# Patient Record
Sex: Female | Born: 1950 | Race: White | Hispanic: No | Marital: Single | Smoking: Never smoker
Health system: Southern US, Community
[De-identification: ages and names within clinical notes are randomized; demographics above are authoritative.]

## PROBLEM LIST (undated history)

## (undated) DIAGNOSIS — R0989 Other specified symptoms and signs involving the circulatory and respiratory systems: Secondary | ICD-10-CM

## (undated) DIAGNOSIS — M1990 Unspecified osteoarthritis, unspecified site: Secondary | ICD-10-CM

## (undated) DIAGNOSIS — E669 Obesity, unspecified: Secondary | ICD-10-CM

## (undated) DIAGNOSIS — K219 Gastro-esophageal reflux disease without esophagitis: Secondary | ICD-10-CM

## (undated) DIAGNOSIS — J45909 Unspecified asthma, uncomplicated: Secondary | ICD-10-CM

## (undated) DIAGNOSIS — D242 Benign neoplasm of left breast: Secondary | ICD-10-CM

## (undated) DIAGNOSIS — I10 Essential (primary) hypertension: Secondary | ICD-10-CM

## (undated) HISTORY — PX: CHOLECYSTECTOMY: SHX55

## (undated) HISTORY — PX: BREAST SURGERY: SHX581

## (undated) HISTORY — PX: DILATION AND CURETTAGE OF UTERUS: SHX78

---

## 2004-09-17 ENCOUNTER — Other Ambulatory Visit: Admission: RE | Admit: 2004-09-17 | Discharge: 2004-09-17 | Payer: Self-pay | Admitting: Obstetrics and Gynecology

## 2004-10-29 ENCOUNTER — Encounter (INDEPENDENT_AMBULATORY_CARE_PROVIDER_SITE_OTHER): Payer: Self-pay | Admitting: *Deleted

## 2004-10-29 ENCOUNTER — Ambulatory Visit (HOSPITAL_COMMUNITY): Admission: RE | Admit: 2004-10-29 | Discharge: 2004-10-29 | Payer: Self-pay | Admitting: Obstetrics and Gynecology

## 2005-10-11 ENCOUNTER — Other Ambulatory Visit: Admission: RE | Admit: 2005-10-11 | Discharge: 2005-10-11 | Payer: Self-pay | Admitting: Obstetrics and Gynecology

## 2005-11-15 ENCOUNTER — Encounter: Admission: RE | Admit: 2005-11-15 | Discharge: 2005-11-15 | Payer: Self-pay | Admitting: Obstetrics and Gynecology

## 2005-12-05 ENCOUNTER — Encounter: Admission: RE | Admit: 2005-12-05 | Discharge: 2005-12-05 | Payer: Self-pay | Admitting: Obstetrics and Gynecology

## 2005-12-05 ENCOUNTER — Encounter (INDEPENDENT_AMBULATORY_CARE_PROVIDER_SITE_OTHER): Payer: Self-pay | Admitting: *Deleted

## 2007-12-10 ENCOUNTER — Encounter: Admission: RE | Admit: 2007-12-10 | Discharge: 2007-12-10 | Payer: Self-pay | Admitting: Obstetrics and Gynecology

## 2008-02-04 ENCOUNTER — Encounter: Admission: RE | Admit: 2008-02-04 | Discharge: 2008-02-04 | Payer: Self-pay | Admitting: Surgery

## 2008-02-29 ENCOUNTER — Encounter (INDEPENDENT_AMBULATORY_CARE_PROVIDER_SITE_OTHER): Payer: Self-pay | Admitting: Diagnostic Radiology

## 2008-02-29 ENCOUNTER — Encounter: Admission: RE | Admit: 2008-02-29 | Discharge: 2008-02-29 | Payer: Self-pay | Admitting: Surgery

## 2008-06-20 HISTORY — PX: EYE SURGERY: SHX253

## 2010-07-11 ENCOUNTER — Encounter: Payer: Self-pay | Admitting: Obstetrics and Gynecology

## 2010-11-05 NOTE — H&P (Signed)
NAMERIVEN, BEEBE NO.:  [id number]   MEDICAL RECORD NO.:  [id number]           PATIENT TYPE:   LOCATION:                                 FACILITY:   PHYSICIAN:  Guy Sandifer. Henderson Cloud, M.D.      DATE OF BIRTH:   DATE OF ADMISSION:  10/29/2004  DATE OF DISCHARGE:                                HISTORY & PHYSICAL   CHIEF COMPLAINT:  Postmenopausal bleeding.   HISTORY OF PRESENT ILLNESS:  The patient is a 60 year old single white  female, G0, P0, who was on the birth control pill until about 3 years ago.  She then had menses for several months, but that completely stopped for the  past 2 years.  In February of this year, she had 4-5 heavy days of bleeding  with cramping and clotting.  The bleeding has now stopped.  Ultrasound on  Oct 18, 2004 was consistent with the uterus measuring 10.5 x 5.9 x 6.7 cm.  Endometrial stripe is 1.5 cm.  Sonohystogram is negative for masses.  Pap  smear was benign.  After discussion of the patient's the patient is being  admitted for hysteroscopy D&C.  Potential risks and complications have been  reviewed with the patient preoperatively.   PAST MEDICAL HISTORY:  1.  Asthma.  2.  Complains of dependent edema when the weather gets warm.   PAST SURGICAL HISTORY:  Knee injury several years ago.   FAMILY HISTORY:  Multiple gestation in brother's family.  Diabetes in  maternal grandmother.  Lung cancer in father.  Colon cancer in mother.  Lung  and breast cancer in mother.  Thyroid cancer in brother.  Bladder cancer in  another brother.  Chronic hypertension in mother.  Congestive heart failure  in mother.  Renal failure in mother.   MEDICATIONS:  Inhalers p.r.n.   ALLERGIES:  No known drug allergies.   SOCIAL HISTORY:  Denies tobacco, alcohol, or drug abuse.   REVIEW OF SYSTEMS:  NEUROLOGIC:  Denies headache.  CARDIAC:  Denies chest  pain.  PULMONARY:  Denies shortness of breath.  GI:  Denies recent changes  in bowel habits.   PHYSICAL EXAMINATION:  VITAL SIGNS:  Height 5 feet, 4.5 inches.  Weight  321.5 pounds.  Blood pressure 146/86.  HEENT:  Without thyromegaly.  LUNGS:  Clear to auscultation.  HEART:  Regular rate and rhythm.  BACK:  Without CVA tenderness.  BREASTS:  Without mass, retraction, or discharge.  ABDOMEN:  Obese, soft, nontender, without palpable masses.  PELVIC:  Vulva, vagina, and cervix without lesion.  Pelvic exam totally  compromised with patient's habitus.  EXTREMITIES/NEUROLOGICAL EXAM:  Grossly within normal limits.   ASSESSMENT:  Postmenopausal bleeding.   PLAN:  Hysteroscopy D&C.      JET/MEDQ  D:  10/27/2004  T:  10/27/2004  Job:  [id number]

## 2010-11-05 NOTE — Op Note (Signed)
Danielle [name], Danielle [name]                 ACCOUNT NO.:  [id number]   MEDICAL RECORD NO.:  [id number]          PATIENT TYPE:  AMB   LOCATION:  SDC                           FACILITY:  [facility]   PHYSICIAN:  Guy Sandifer. Henderson Cloud, M.D. DATE OF BIRTH:  07/28/1950   DATE OF PROCEDURE:  10/29/2004  DATE OF DISCHARGE:                                 OPERATIVE REPORT   PREOPERATIVE DIAGNOSIS:  Postmenopausal bleeding.   POSTOPERATIVE DIAGNOSIS:  Postmenopausal bleeding.   PROCEDURES:  1.  Hysteroscopy with dilatation and curettage.  2.  Xylocaine 1% paracervical block.   SURGEON:  Guy Sandifer. Henderson Cloud, M.D.   ANESTHESIA:  MAC.   SPECIMENS:  Endometrial curettings.   I&O OF SORBITOL DISTENDING MEDIUM:  Even.   INDICATIONS AND CONSENT:  This patient is a 60 year old single white female,  G0, P0, with postmenopausal bleeding.  Details are dictated in the history  and physical.  Hysteroscopy, dilatation and curettage is discussed with the  patient.  The potential risks and complications have been discussed  preoperatively, including but not limited to infection, uterine perforation,  bowel, bladder or ureteral damage, bleeding requiring transfusion of blood  products with a possible transfusion reaction, HIV and hepatitis  acquisition, DVT, PE, and pneumonia, laparoscopy, laparotomy, hysterectomy.  All questions have been answered, and consent is signed on the chart.   FINDINGS:  Endometrial cavity is normal in appearance.  The fallopian tube  ostia are identified bilaterally.   PROCEDURE:  The patient was taken to the operating room, where she is  identified, placed in the dorsal supine position and given intravenous  anesthesia.  She is then placed in the dorsal lithotomy position, where she  is gently prepped, the bladder is straight-catheterized, and she is draped  in a sterile fashion.  A bivalve speculum is placed in the vagina and  anterior cervical lip is injected with 1% Xylocaine and grasped  with a  single-tooth tenaculum.  A paracervical block at the 2, 4, 5, 7, 8 and 10  o'clock positions with approximately 20 mL total of 1% Xylocaine plain is  done.  The cervix is then gently progressively dilated to a 25 Pratt  dilator.  Diagnostic hysteroscope is then placed in the endocervical canal  and advanced under direct visualization using sorbitol distending media.  The above  findings are noted.  The hysteroscope is withdrawn and sharp curettage is  carried out.  Good hemostasis is noted.  All instruments are removed and  good hemostasis is again noted.  All counts are correct.  The patient is  awakened and taken to the recovery room in stable condition.      JET/MEDQ  D:  10/29/2004  T:  10/29/2004  Job:  [id number]

## 2013-11-14 ENCOUNTER — Other Ambulatory Visit: Payer: Self-pay | Admitting: Obstetrics and Gynecology

## 2013-11-14 DIAGNOSIS — R928 Other abnormal and inconclusive findings on diagnostic imaging of breast: Secondary | ICD-10-CM

## 2013-12-04 ENCOUNTER — Ambulatory Visit
Admission: RE | Admit: 2013-12-04 | Discharge: 2013-12-04 | Disposition: A | Discharge status: Home / Self Care | Payer: BC Managed Care – PPO | Source: Ambulatory Visit | Attending: Obstetrics and Gynecology | Admitting: Obstetrics and Gynecology

## 2013-12-04 ENCOUNTER — Encounter (INDEPENDENT_AMBULATORY_CARE_PROVIDER_SITE_OTHER): Payer: Self-pay

## 2013-12-04 DIAGNOSIS — R928 Other abnormal and inconclusive findings on diagnostic imaging of breast: Secondary | ICD-10-CM

## 2014-05-27 NOTE — Pre-Procedure Instructions (Addendum)
Danielle [name]  05/27/2014   Your procedure is scheduled on:  06/03/14  Report to [facility] cone short stay admitting at 800 AM.  Call this number if you have problems the morning of surgery: [phone]   Remember:   Do not eat food or drink liquids after midnight.   Take these medicines the morning of surgery with A SIP OF WATER: inhalers if needed, bring albuterol inhaler to hosp  STOP all herbel meds, nsaids (aleve,naproxen,advil,ibuprofen) 5 days prior to surgery(tomorrow 05/29/14) including vitamins, aspirin, nabumatone   Do not wear jewelry, make-up or nail polish.  Do not wear lotions, powders, or perfumes. You may wear deodorant.  Do not shave 48 hours prior to surgery. Men may shave face and neck.  Do not bring valuables to the hospital.  [facility] is not responsible                  for any belongings or valuables.               Contacts, dentures or bridgework may not be worn into surgery.  Leave suitcase in the car. After surgery it may be brought to your room.  For patients admitted to the hospital, discharge time is determined by your                treatment team.               Patients discharged the day of surgery will not be allowed to drive  home.  Name and phone number of your driver:   Special Instructions:  Special Instructions: [facility] - Preparing for Surgery  Before surgery, you can play an important role.  Because skin is not sterile, your skin needs to be as free of germs as possible.  You can reduce the number of germs on you skin by washing with CHG (chlorahexidine gluconate) soap before surgery.  CHG is an antiseptic cleaner which kills germs and bonds with the skin to continue killing germs even after washing.  Please DO NOT use if you have an allergy to CHG or antibacterial soaps.  If your skin becomes reddened/irritated stop using the CHG and inform your nurse when you arrive at Short Stay.  Do not shave (including legs and underarms) for at least 48  hours prior to the first CHG shower.  You may shave your face.  Please follow these instructions carefully:   1.  Shower with CHG Soap the night before surgery and the morning of Surgery.  2.  If you choose to wash your hair, wash your hair first as usual with your normal shampoo.  3.  After you shampoo, rinse your hair and body thoroughly to remove the Shampoo.  4.  Use CHG as you would any other liquid soap.  You can apply chg directly  to the skin and wash gently with scrungie or a clean washcloth.  5.  Apply the CHG Soap to your body ONLY FROM THE NECK DOWN.  Do not use on open wounds or open sores.  Avoid contact with your eyes ears, mouth and genitals (private parts).  Wash genitals (private parts)       with your normal soap.  6.  Wash thoroughly, paying special attention to the area where your surgery will be performed.  7.  Thoroughly rinse your body with warm water from the neck down.  8.  DO NOT shower/wash with your normal soap after using and rinsing off the CHG Soap.  9.  Pat yourself dry with a clean towel.            10.  Wear clean pajamas.            11.  Place clean sheets on your bed the night of your first shower and do not sleep with pets.  Day of Surgery  Do not apply any lotions/deodorants the morning of surgery.  Please wear clean clothes to the hospital/surgery center.   Please read over the following fact sheets that you were given: Pain Booklet, Coughing and Deep Breathing, Blood Transfusion Information, Total Joint Packet, MRSA Information and Surgical Site Infection Prevention

## 2014-05-28 ENCOUNTER — Encounter (HOSPITAL_COMMUNITY)
Admission: RE | Admit: 2014-05-28 | Discharge: 2014-05-28 | Disposition: A | Discharge status: Home / Self Care | Payer: BC Managed Care – PPO | Source: Ambulatory Visit | Attending: Orthopaedic Surgery | Admitting: Orthopaedic Surgery

## 2014-05-28 ENCOUNTER — Encounter (HOSPITAL_COMMUNITY): Payer: Self-pay

## 2014-05-28 ENCOUNTER — Encounter (HOSPITAL_COMMUNITY)
Admission: RE | Admit: 2014-05-28 | Discharge: 2014-05-28 | Disposition: A | Discharge status: Home / Self Care | Payer: BC Managed Care – PPO | Source: Ambulatory Visit | Attending: Orthopedic Surgery | Admitting: Orthopedic Surgery

## 2014-05-28 DIAGNOSIS — M1990 Unspecified osteoarthritis, unspecified site: Secondary | ICD-10-CM | POA: Insufficient documentation

## 2014-05-28 DIAGNOSIS — I10 Essential (primary) hypertension: Secondary | ICD-10-CM | POA: Insufficient documentation

## 2014-05-28 DIAGNOSIS — K219 Gastro-esophageal reflux disease without esophagitis: Secondary | ICD-10-CM | POA: Insufficient documentation

## 2014-05-28 DIAGNOSIS — Z01818 Encounter for other preprocedural examination: Secondary | ICD-10-CM | POA: Diagnosis present

## 2014-05-28 DIAGNOSIS — Z9049 Acquired absence of other specified parts of digestive tract: Secondary | ICD-10-CM | POA: Insufficient documentation

## 2014-05-28 DIAGNOSIS — J45909 Unspecified asthma, uncomplicated: Secondary | ICD-10-CM | POA: Diagnosis not present

## 2014-05-28 HISTORY — DX: Unspecified asthma, uncomplicated: J45.909

## 2014-05-28 HISTORY — DX: Gastro-esophageal reflux disease without esophagitis: K21.9

## 2014-05-28 HISTORY — DX: Unspecified osteoarthritis, unspecified site: M19.90

## 2014-05-28 HISTORY — DX: Essential (primary) hypertension: I10

## 2014-05-28 LAB — SURGICAL PCR SCREEN
MRSA, PCR: NEGATIVE
Staphylococcus aureus: NEGATIVE

## 2014-05-28 LAB — URINALYSIS, ROUTINE W REFLEX MICROSCOPIC
BILIRUBIN URINE: NEGATIVE
Glucose, UA: NEGATIVE mg/dL
HGB URINE DIPSTICK: NEGATIVE
Ketones, ur: NEGATIVE mg/dL
Leukocytes, UA: NEGATIVE
Nitrite: NEGATIVE
PH: 6.5 (ref 5.0–8.0)
Protein, ur: NEGATIVE mg/dL
SPECIFIC GRAVITY, URINE: 1.022 (ref 1.005–1.030)
UROBILINOGEN UA: 0.2 mg/dL (ref 0.0–1.0)

## 2014-05-28 LAB — CBC WITH DIFFERENTIAL/PLATELET
Basophils Absolute: 0 10*3/uL (ref 0.0–0.1)
Basophils Relative: 1 % (ref 0–1)
Eosinophils Absolute: 0.3 10*3/uL (ref 0.0–0.7)
Eosinophils Relative: 5 % (ref 0–5)
HEMATOCRIT: 39.3 % (ref 36.0–46.0)
HEMOGLOBIN: 13 g/dL (ref 12.0–15.0)
LYMPHS ABS: 1.1 10*3/uL (ref 0.7–4.0)
Lymphocytes Relative: 21 % (ref 12–46)
MCH: 31.9 pg (ref 26.0–34.0)
MCHC: 33.1 g/dL (ref 30.0–36.0)
MCV: 96.3 fL (ref 78.0–100.0)
Monocytes Absolute: 0.4 10*3/uL (ref 0.1–1.0)
Monocytes Relative: 7 % (ref 3–12)
NEUTROS ABS: 3.5 10*3/uL (ref 1.7–7.7)
NEUTROS PCT: 66 % (ref 43–77)
Platelets: 188 10*3/uL (ref 150–400)
RBC: 4.08 MIL/uL (ref 3.87–5.11)
RDW: 13.7 % (ref 11.5–15.5)
WBC: 5.2 10*3/uL (ref 4.0–10.5)

## 2014-05-28 LAB — PROTIME-INR
INR: 1.04 (ref 0.00–1.49)
PROTHROMBIN TIME: 13.7 s (ref 11.6–15.2)

## 2014-05-28 LAB — COMPREHENSIVE METABOLIC PANEL
ALK PHOS: 71 U/L (ref 39–117)
ALT: 15 U/L (ref 0–35)
ANION GAP: 13 (ref 5–15)
AST: 23 U/L (ref 0–37)
Albumin: 3.7 g/dL (ref 3.5–5.2)
BILIRUBIN TOTAL: 0.5 mg/dL (ref 0.3–1.2)
BUN: 15 mg/dL (ref 6–23)
CHLORIDE: 101 meq/L (ref 96–112)
CO2: 28 meq/L (ref 19–32)
CREATININE: 0.84 mg/dL (ref 0.50–1.10)
Calcium: 9.4 mg/dL (ref 8.4–10.5)
GFR, EST AFRICAN AMERICAN: 85 mL/min — AB (ref >90)
GFR, EST NON AFRICAN AMERICAN: 73 mL/min — AB (ref >90)
GLUCOSE: 94 mg/dL (ref 70–99)
POTASSIUM: 3.4 meq/L — AB (ref 3.7–5.3)
Sodium: 142 mEq/L (ref 137–147)
Total Protein: 7.5 g/dL (ref 6.0–8.3)

## 2014-05-28 LAB — APTT: APTT: 29 s (ref 24–37)

## 2014-05-28 LAB — ABO/RH: ABO/RH(D): O NEG

## 2014-05-28 LAB — TYPE AND SCREEN
ABO/RH(D): O NEG
Antibody Screen: NEGATIVE

## 2014-05-28 NOTE — Progress Notes (Signed)
req'd notes any cardiac tests drom dr Eber Hong carrillon Carloyn Jaeger

## 2014-05-28 NOTE — Progress Notes (Signed)
Unable to get teds due to calf size. Office called

## 2014-05-28 NOTE — Progress Notes (Signed)
   05/28/14 0823  OBSTRUCTIVE SLEEP APNEA  Have you ever been diagnosed with sleep apnea through a sleep study? No  Do you snore loudly (loud enough to be heard through closed doors)?  0  Do you often feel tired, fatigued, or sleepy during the daytime? 0  Has anyone observed you stop breathing during your sleep? 0  Do you have, or are you being treated for high blood pressure? 1  BMI more than 35 kg/m2? 1  Age over 62 years old? 1  Neck circumference greater than 40 cm/16 inches? 1 (16.5)  Gender: 0  Obstructive Sleep Apnea Score 4  Score 4 or greater  Results sent to PCP

## 2014-05-29 LAB — URINE CULTURE

## 2014-05-29 NOTE — Progress Notes (Signed)
Anesthesia Chart Review:  Patient is a 63 year old female scheduled for right TKR on 06/03/14 by Dr. Durward Fortes.  History includes non-smoker, HTN, GERD, asthma, arthritis, cholecystectomy. BMI is consistent with morbid obesity.  OSA screening score was 4. PCP is Dr. Eber Hong with [facility] in [place].  She was seen by Mauricio Po, FNP on 05/13/14 for a preoperative evaluation and was felt to be low risk for surgery from a cardiac standpoint. (See note in Care Everywhere)  EKG on 05/28/14 showed: NSR, low voltage QRS.  I don't currently have a comparison EKG, but 06/13/11 EKG interpretation in Care Everywhere read: NSR, low voltage QRS, cannot rule out anterior infarct (age undetermined) which would be consistent with her most recent EKG findings.    05/28/14 CXR: No active cardiopulmonary disease. Slight hyper aeration.  Preoperative labs noted. Urine culture is still pending.  She was cleared by her PCP for surgery.  If no acute changes then I would anticipate that she could proceed as planned.  George Hugh [facility] Short Stay Center/Anesthesiology Phone [phone] 05/29/2014 11:47 AM

## 2014-06-02 MED ORDER — SODIUM CHLORIDE 0.9 % IV SOLN: INTRAVENOUS | Status: DC

## 2014-06-02 MED ORDER — ACETAMINOPHEN 10 MG/ML IV SOLN
1000.0000 mg | Freq: Four times a day (QID) | INTRAVENOUS | Status: DC
  Administered 2014-06-03 (×2): 1000 mg via INTRAVENOUS
  Filled 2014-06-02: qty 100

## 2014-06-02 MED ORDER — CHLORHEXIDINE GLUCONATE 4 % EX LIQD
60.0000 mL | Freq: Once | CUTANEOUS | Status: DC
Start: 2014-06-02 — End: 2014-06-03
  Filled 2014-06-02: qty 60

## 2014-06-02 MED ORDER — DEXTROSE 5 % IV SOLN
3.0000 g | INTRAVENOUS | Status: AC
  Administered 2014-06-03: 3 g via INTRAVENOUS
  Filled 2014-06-02: qty 3000

## 2014-06-02 NOTE — H&P (Signed)
CHIEF COMPLAINT:  Painful right knee.     HISTORY OF PRESENT ILLNESS:  Danielle [name] is a very pleasant 63 year old white female who is seen today for evaluation of her right knee.  She has had about 10 years of problems with the knee.  She has been treated by a physician, Dr. Tamera Punt, in [place] and has had several cortisone injections and Euflexxa injections about 3 years ago.  She states her pain is about 8/10 now.  She had tried physical therapy for a fairly good period of time last year.  She has been on Relafen, Voltaren, and several cortisone injections.  She has even tried the diclofenac or Voltaren gel.  She states that she has pretty much pain with every step, some nighttime pain.  She does have some catching and is starting to have problems with her activities of daily living.  She can almost do her light housework but, other than that, she is pretty much unable to do it.  She walks with a cane at this time.  She is continuing again having popping/grinding symptoms and trouble with her balance secondary to her pain.  Seen today for evaluation.     PAST MEDICAL HISTORY:  In general, her health is fair.     PAST SURGICAL HISTORY:   1.  Dilatation and curettage.  2.  Laparoscopic cholecystectomy.  3.  Right breast biopsy. 4.  June and September 2010, cataract extractions. 5.  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   6.  She had removal of oil bubble from the right eye in August 2013.     MEDICATIONS:   1.  Hydrochlorothiazide 25 mg daily.  2.  Relafen 750 b.i.d.  3.  Flovent HFA 110 mcg b.i.d.  4.  Ventolin HFA 90 mcg 2 puffs as needed p.r.n.  5.  Voltaren gel 3-4 times a day   ALLERGIES:  None known.    REVIEW OF SYSTEMS:  A 14-point review of systems is positive for a cataract extraction as well as retinal detachments.  She had bronchitis many years ago.  She has had hypertension for about 5 years.  Apparently she has asthma and uses an  inhaler.  This is more seasonal.  Her last bladder infection was 5-6 years ago.     FAMILY HISTORY:  Positive for a mother who died at age 64 from congestive heart failure and dementia.  She did have gout and hypertension.  Father deceased at age 32 from lung cancer.  Brother deceased at age 3 from bladder cancer with metastasis to bone.  She has 3 other brothers, age 8, 26 and 53, who are alive and well.  One sister who has arthritis and is still alive at age 47.     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  She denies use of tobacco or alcohol.     PHYSICAL EXAMINATION:  Exam today reveals a 63 year old white female, well developed, well nourished, obese, alert, cooperative, in moderate distress secondary to right knee pain.  Height is 62-1/2-inches, weight 272 pounds, BMI 49. Vital signs:  Temperature 97.6, pulse 95, respirations 16, blood pressure 161/84.   Head:  Normocephalic.  Eyes:  Pupils equal, round and reactive to light and accomodation with extraocular movements intact.  Ears, nose and throat:  Benign. Chest:  Good expansion.   Lungs:  Essentially clear with decreased breath sounds. Cardiac:  Had a  regular rhythm and rate.  Markedly distant heart sounds and no murmurs noted. Pulses:  Trace to 1+ in lower extremities, bilateral and symmetric.   CNS:  She is oriented x3 and cranial nerves II-XII grossly intact.  Genitorectal and breast:  Exam not indicated for an orthopedic evaluation.   Musculoskeletal:  The right knee can range from about 10-90 degrees only.  Diffuse tenderness to palpation over the medial joint.  Crepitance with range of motion.  Really cannot tell if she has an effusion or not.  Ligamentously she appears to have a little bit of instability of the knee with varus and valgus stressing.  Calves are supple and nontender.  Neurovascularly intact distally.  Good motion of the hips bilaterally.     RADIOGRAPHS:  Radiographic  studies reveal x-rays, AP, lateral and patella, standing, of her right knee, which has marked varus position.  She is bone-on-bone, medial compartment, with very large osteophytes, both medially and laterally.     CLINICAL IMPRESSION:   1.  Primary end-stage OA of the right knee. 2.  History of hypertension. 3.  History of asthma. 4.  Exogenous obesity with BMI of 49.     RECOMMENDATIONS:   1.  At this time, I have reviewed documentation from Dr. Brynda Greathouse, who feels that she is a candidate for a total knee replacement from both primary care and medicine, as well as from cardiology.  Felt to be a low risk for that.   2.  Because of her continued pain and discomfort, our plan is to proceed with right total knee arthroplasty.  The procedure risks and benefits were fully explained to her in detail using models.  Questions were answered completely.  She is now ready to proceed with total joint replacement.    Mike Craze [place], [place] [phone]  06/02/2014 4:49 PM

## 2014-06-02 NOTE — Anesthesia Preprocedure Evaluation (Signed)
Anesthesia Evaluation  Patient identified by MRN, date of birth, ID band Patient awake    Reviewed: Allergy & Precautions, H&P , NPO status , Patient's Chart, lab work & pertinent test results, reviewed documented beta blocker date and time   Airway        Dental   Pulmonary asthma (inhaler) ,          Cardiovascular hypertension, Pt. on medications  EKG ok   Neuro/Psych    GI/Hepatic GERD-  Medicated,  Endo/Other    Renal/GU      Musculoskeletal   Abdominal   Peds  Hematology   Anesthesia Other Findings   Reproductive/Obstetrics                             Anesthesia Physical Anesthesia Plan  ASA: III  Anesthesia Plan: General   Post-op Pain Management: MAC Combined w/ Regional for Post-op pain   Induction: Intravenous  Airway Management Planned: Oral ETT  Additional Equipment:   Intra-op Plan:   Post-operative Plan: Extubation in OR  Informed Consent: I have reviewed the patients History and Physical, chart, labs and discussed the procedure including the risks, benefits and alternatives for the proposed anesthesia with the patient or authorized representative who has indicated his/her understanding and acceptance.     Plan Discussed with:   Anesthesia Plan Comments:         Anesthesia Quick Evaluation

## 2014-06-03 ENCOUNTER — Encounter (HOSPITAL_COMMUNITY)
Admission: RE | Disposition: A | Discharge status: Skilled Nursing Facility | Payer: Self-pay | Source: Ambulatory Visit | Attending: Orthopaedic Surgery

## 2014-06-03 ENCOUNTER — Inpatient Hospital Stay (HOSPITAL_COMMUNITY): Payer: BC Managed Care – PPO | Admitting: Vascular Surgery

## 2014-06-03 ENCOUNTER — Inpatient Hospital Stay (HOSPITAL_COMMUNITY)
Admission: RE | Admit: 2014-06-03 | Discharge: 2014-06-05 | DRG: 470 | Disposition: A | Discharge status: Skilled Nursing Facility | Payer: BC Managed Care – PPO | Source: Ambulatory Visit | Attending: Orthopaedic Surgery | Admitting: Orthopaedic Surgery

## 2014-06-03 ENCOUNTER — Encounter (HOSPITAL_COMMUNITY): Payer: Self-pay | Admitting: Certified Registered Nurse Anesthetist

## 2014-06-03 ENCOUNTER — Inpatient Hospital Stay (HOSPITAL_COMMUNITY): Payer: BC Managed Care – PPO | Admitting: Anesthesiology

## 2014-06-03 DIAGNOSIS — K219 Gastro-esophageal reflux disease without esophagitis: Secondary | ICD-10-CM | POA: Diagnosis present

## 2014-06-03 DIAGNOSIS — E876 Hypokalemia: Secondary | ICD-10-CM | POA: Diagnosis not present

## 2014-06-03 DIAGNOSIS — Z808 Family history of malignant neoplasm of other organs or systems: Secondary | ICD-10-CM | POA: Diagnosis not present

## 2014-06-03 DIAGNOSIS — Z8052 Family history of malignant neoplasm of bladder: Secondary | ICD-10-CM

## 2014-06-03 DIAGNOSIS — M1711 Unilateral primary osteoarthritis, right knee: Secondary | ICD-10-CM

## 2014-06-03 DIAGNOSIS — Z801 Family history of malignant neoplasm of trachea, bronchus and lung: Secondary | ICD-10-CM | POA: Diagnosis not present

## 2014-06-03 DIAGNOSIS — Z6841 Body Mass Index (BMI) 40.0 and over, adult: Secondary | ICD-10-CM

## 2014-06-03 DIAGNOSIS — J45909 Unspecified asthma, uncomplicated: Secondary | ICD-10-CM | POA: Diagnosis present

## 2014-06-03 DIAGNOSIS — I10 Essential (primary) hypertension: Secondary | ICD-10-CM | POA: Diagnosis present

## 2014-06-03 DIAGNOSIS — Z7901 Long term (current) use of anticoagulants: Secondary | ICD-10-CM | POA: Diagnosis not present

## 2014-06-03 DIAGNOSIS — Z8249 Family history of ischemic heart disease and other diseases of the circulatory system: Secondary | ICD-10-CM

## 2014-06-03 DIAGNOSIS — Z96659 Presence of unspecified artificial knee joint: Secondary | ICD-10-CM

## 2014-06-03 DIAGNOSIS — Z79899 Other long term (current) drug therapy: Secondary | ICD-10-CM | POA: Diagnosis not present

## 2014-06-03 DIAGNOSIS — Z8261 Family history of arthritis: Secondary | ICD-10-CM | POA: Diagnosis not present

## 2014-06-03 DIAGNOSIS — M25561 Pain in right knee: Secondary | ICD-10-CM | POA: Diagnosis present

## 2014-06-03 HISTORY — PX: TOTAL KNEE ARTHROPLASTY: SHX125

## 2014-06-03 SURGERY — ARTHROPLASTY, KNEE, TOTAL
Anesthesia: General | Site: Knee | Laterality: Right

## 2014-06-03 MED ORDER — LORATADINE 10 MG PO TABS
10.0000 mg | ORAL_TABLET | Freq: Every day | ORAL | Status: DC
  Administered 2014-06-03 – 2014-06-05 (×3): 10 mg via ORAL
  Filled 2014-06-03 (×3): qty 1

## 2014-06-03 MED ORDER — KETOROLAC TROMETHAMINE 15 MG/ML IJ SOLN
7.5000 mg | Freq: Four times a day (QID) | INTRAMUSCULAR | Status: AC
  Administered 2014-06-03 – 2014-06-04 (×3): 7.5 mg via INTRAVENOUS

## 2014-06-03 MED ORDER — LIDOCAINE HCL (CARDIAC) 10 MG/ML IV SOLN
INTRAVENOUS | Status: DC | PRN
  Administered 2014-06-03: 100 mg via INTRAVENOUS

## 2014-06-03 MED ORDER — METHOCARBAMOL 500 MG PO TABS
500.0000 mg | ORAL_TABLET | Freq: Four times a day (QID) | ORAL | Status: DC | PRN
Start: 2014-06-03 — End: 2014-06-05
  Administered 2014-06-04: 500 mg via ORAL
  Filled 2014-06-03: qty 1

## 2014-06-03 MED ORDER — PROPOFOL 10 MG/ML IV BOLUS
INTRAVENOUS | Status: DC | PRN
  Administered 2014-06-03: 170 mg via INTRAVENOUS

## 2014-06-03 MED ORDER — FENTANYL CITRATE 0.05 MG/ML IJ SOLN
INTRAMUSCULAR | Status: AC
  Filled 2014-06-03: qty 5

## 2014-06-03 MED ORDER — PHENOL 1.4 % MT LIQD: 1.0000 | OROMUCOSAL | Status: DC | PRN

## 2014-06-03 MED ORDER — HYDROMORPHONE HCL 1 MG/ML IJ SOLN: 1.0000 mg | INTRAMUSCULAR | Status: DC | PRN

## 2014-06-03 MED ORDER — METHOCARBAMOL 1000 MG/10ML IJ SOLN
500.0000 mg | Freq: Four times a day (QID) | INTRAVENOUS | Status: DC | PRN
  Filled 2014-06-03: qty 5

## 2014-06-03 MED ORDER — SODIUM CHLORIDE 0.9 % IR SOLN
Status: DC | PRN
  Administered 2014-06-03: 3000 mL

## 2014-06-03 MED ORDER — BISACODYL 5 MG PO TBEC
5.0000 mg | DELAYED_RELEASE_TABLET | Freq: Every day | ORAL | Status: DC | PRN
Start: 2014-06-03 — End: 2014-06-05

## 2014-06-03 MED ORDER — ARTIFICIAL TEARS OP OINT
TOPICAL_OINTMENT | OPHTHALMIC | Status: AC
  Filled 2014-06-03: qty 3.5

## 2014-06-03 MED ORDER — FENTANYL CITRATE 0.05 MG/ML IJ SOLN
INTRAMUSCULAR | Status: DC | PRN
  Administered 2014-06-03 (×2): 50 ug via INTRAVENOUS
  Administered 2014-06-03: 100 ug via INTRAVENOUS

## 2014-06-03 MED ORDER — NEOSTIGMINE METHYLSULFATE 10 MG/10ML IV SOLN
INTRAVENOUS | Status: AC
  Filled 2014-06-03: qty 1

## 2014-06-03 MED ORDER — THROMBIN 20000 UNITS EX SOLR
CUTANEOUS | Status: DC | PRN
  Administered 2014-06-03: 20000 [IU] via TOPICAL

## 2014-06-03 MED ORDER — PROMETHAZINE HCL 25 MG/ML IJ SOLN: 6.2500 mg | INTRAMUSCULAR | Status: DC | PRN

## 2014-06-03 MED ORDER — ONDANSETRON HCL 4 MG/2ML IJ SOLN: 4.0000 mg | Freq: Four times a day (QID) | INTRAMUSCULAR | Status: DC | PRN

## 2014-06-03 MED ORDER — ONDANSETRON HCL 4 MG/2ML IJ SOLN
INTRAMUSCULAR | Status: AC
  Filled 2014-06-03: qty 2

## 2014-06-03 MED ORDER — ALBUTEROL SULFATE (2.5 MG/3ML) 0.083% IN NEBU: 2.5000 mg | INHALATION_SOLUTION | Freq: Four times a day (QID) | RESPIRATORY_TRACT | Status: DC | PRN

## 2014-06-03 MED ORDER — ALBUTEROL SULFATE HFA 108 (90 BASE) MCG/ACT IN AERS: 2.0000 | INHALATION_SPRAY | Freq: Four times a day (QID) | RESPIRATORY_TRACT | Status: DC | PRN

## 2014-06-03 MED ORDER — FLUTICASONE PROPIONATE HFA 110 MCG/ACT IN AERO
2.0000 | INHALATION_SPRAY | Freq: Two times a day (BID) | RESPIRATORY_TRACT | Status: DC
  Administered 2014-06-04 (×2): 2 via RESPIRATORY_TRACT
  Filled 2014-06-03 (×2): qty 12

## 2014-06-03 MED ORDER — DOCUSATE SODIUM 100 MG PO CAPS
100.0000 mg | ORAL_CAPSULE | Freq: Two times a day (BID) | ORAL | Status: DC
  Administered 2014-06-03 – 2014-06-05 (×4): 100 mg via ORAL
  Filled 2014-06-03 (×4): qty 1

## 2014-06-03 MED ORDER — METOCLOPRAMIDE HCL 5 MG/ML IJ SOLN
INTRAMUSCULAR | Status: DC | PRN
  Administered 2014-06-03: 10 mg via INTRAVENOUS

## 2014-06-03 MED ORDER — GLYCOPYRROLATE 0.2 MG/ML IJ SOLN
INTRAMUSCULAR | Status: AC
  Filled 2014-06-03: qty 3

## 2014-06-03 MED ORDER — DEXAMETHASONE SODIUM PHOSPHATE 4 MG/ML IJ SOLN
INTRAMUSCULAR | Status: DC | PRN
  Administered 2014-06-03: 4 mg via INTRAVENOUS

## 2014-06-03 MED ORDER — SODIUM CHLORIDE 0.9 % IV SOLN
INTRAVENOUS | Status: DC
  Administered 2014-06-04: 03:00:00 via INTRAVENOUS

## 2014-06-03 MED ORDER — MAGNESIUM CITRATE PO SOLN: 1.0000 | Freq: Once | ORAL | Status: AC | PRN

## 2014-06-03 MED ORDER — FENTANYL CITRATE 0.05 MG/ML IJ SOLN: 25.0000 ug | INTRAMUSCULAR | Status: DC | PRN

## 2014-06-03 MED ORDER — CEFAZOLIN SODIUM-DEXTROSE 2-3 GM-% IV SOLR
2.0000 g | Freq: Four times a day (QID) | INTRAVENOUS | Status: AC
  Administered 2014-06-03 (×2): 2 g via INTRAVENOUS
  Filled 2014-06-03: qty 50

## 2014-06-03 MED ORDER — HYDROCHLOROTHIAZIDE 25 MG PO TABS
25.0000 mg | ORAL_TABLET | Freq: Every day | ORAL | Status: DC
  Administered 2014-06-03 – 2014-06-05 (×3): 25 mg via ORAL
  Filled 2014-06-03 (×3): qty 1

## 2014-06-03 MED ORDER — HYPROMELLOSE (GONIOSCOPIC) 2.5 % OP SOLN: 2.0000 [drp] | Freq: Three times a day (TID) | OPHTHALMIC | Status: DC | PRN

## 2014-06-03 MED ORDER — ACETAMINOPHEN 10 MG/ML IV SOLN
1000.0000 mg | Freq: Four times a day (QID) | INTRAVENOUS | Status: AC
  Administered 2014-06-03 – 2014-06-04 (×4): 1000 mg via INTRAVENOUS
  Filled 2014-06-03 (×5): qty 100

## 2014-06-03 MED ORDER — BUPIVACAINE-EPINEPHRINE 0.25% -1:200000 IJ SOLN
INTRAMUSCULAR | Status: DC | PRN
Start: 2014-06-03 — End: 2014-06-03
  Administered 2014-06-03: 30 mL

## 2014-06-03 MED ORDER — BUPIVACAINE-EPINEPHRINE (PF) 0.5% -1:200000 IJ SOLN
INTRAMUSCULAR | Status: DC | PRN
  Administered 2014-06-03: 30 mL via PERINEURAL

## 2014-06-03 MED ORDER — GLYCOPYRROLATE 0.2 MG/ML IJ SOLN
INTRAMUSCULAR | Status: DC | PRN
  Administered 2014-06-03: 0.6 mg via INTRAVENOUS
  Administered 2014-06-03: 0.2 mg via INTRAVENOUS

## 2014-06-03 MED ORDER — METOCLOPRAMIDE HCL 5 MG/ML IJ SOLN
INTRAMUSCULAR | Status: AC
  Filled 2014-06-03: qty 2

## 2014-06-03 MED ORDER — NEOSTIGMINE METHYLSULFATE 10 MG/10ML IV SOLN
INTRAVENOUS | Status: DC | PRN
  Administered 2014-06-03: 4 mg via INTRAVENOUS

## 2014-06-03 MED ORDER — METOCLOPRAMIDE HCL 10 MG PO TABS: 5.0000 mg | ORAL_TABLET | Freq: Three times a day (TID) | ORAL | Status: DC | PRN

## 2014-06-03 MED ORDER — FENTANYL CITRATE 0.05 MG/ML IJ SOLN
INTRAMUSCULAR | Status: AC
  Filled 2014-06-03: qty 2

## 2014-06-03 MED ORDER — OXYCODONE HCL 5 MG PO TABS
5.0000 mg | ORAL_TABLET | ORAL | Status: DC | PRN
  Administered 2014-06-04 – 2014-06-05 (×3): 10 mg via ORAL
  Filled 2014-06-03 (×3): qty 2

## 2014-06-03 MED ORDER — METOCLOPRAMIDE HCL 5 MG/ML IJ SOLN: 5.0000 mg | Freq: Three times a day (TID) | INTRAMUSCULAR | Status: DC | PRN

## 2014-06-03 MED ORDER — ACETAMINOPHEN 10 MG/ML IV SOLN
INTRAVENOUS | Status: AC
  Filled 2014-06-03: qty 100

## 2014-06-03 MED ORDER — SODIUM CHLORIDE 0.9 % IR SOLN
Status: DC | PRN
  Administered 2014-06-03: 1000 mL

## 2014-06-03 MED ORDER — MIDAZOLAM HCL 2 MG/2ML IJ SOLN
INTRAMUSCULAR | Status: AC
  Administered 2014-06-03: 1 mg
  Filled 2014-06-03: qty 2

## 2014-06-03 MED ORDER — FENTANYL CITRATE 0.05 MG/ML IJ SOLN
50.0000 ug | Freq: Once | INTRAMUSCULAR | Status: AC
  Administered 2014-06-03: 50 ug via INTRAVENOUS

## 2014-06-03 MED ORDER — LACTATED RINGERS IV SOLN
INTRAVENOUS | Status: DC
  Administered 2014-06-03: 09:00:00 via INTRAVENOUS

## 2014-06-03 MED ORDER — MENTHOL 3 MG MT LOZG: 1.0000 | LOZENGE | OROMUCOSAL | Status: DC | PRN

## 2014-06-03 MED ORDER — BUPIVACAINE-EPINEPHRINE (PF) 0.25% -1:200000 IJ SOLN
INTRAMUSCULAR | Status: AC
  Filled 2014-06-03: qty 30

## 2014-06-03 MED ORDER — RIVAROXABAN 10 MG PO TABS
10.0000 mg | ORAL_TABLET | Freq: Every day | ORAL | Status: DC
  Administered 2014-06-04 – 2014-06-05 (×2): 10 mg via ORAL
  Filled 2014-06-03 (×3): qty 1

## 2014-06-03 MED ORDER — LACTATED RINGERS IV SOLN
INTRAVENOUS | Status: DC | PRN
  Administered 2014-06-03 (×3): via INTRAVENOUS

## 2014-06-03 MED ORDER — PROPOFOL 10 MG/ML IV BOLUS
INTRAVENOUS | Status: AC
  Filled 2014-06-03: qty 20

## 2014-06-03 MED ORDER — KETOROLAC TROMETHAMINE 15 MG/ML IJ SOLN
INTRAMUSCULAR | Status: AC
  Filled 2014-06-03: qty 1

## 2014-06-03 MED ORDER — MIDAZOLAM HCL 2 MG/2ML IJ SOLN: 1.0000 mg | Freq: Once | INTRAMUSCULAR | Status: DC

## 2014-06-03 MED ORDER — THROMBIN 20000 UNITS EX SOLR
CUTANEOUS | Status: AC
  Filled 2014-06-03: qty 20000

## 2014-06-03 MED ORDER — ONDANSETRON HCL 4 MG PO TABS: 4.0000 mg | ORAL_TABLET | Freq: Four times a day (QID) | ORAL | Status: DC | PRN

## 2014-06-03 MED ORDER — LACTATED RINGERS IV SOLN: INTRAVENOUS | Status: DC

## 2014-06-03 MED ORDER — POLYVINYL ALCOHOL 1.4 % OP SOLN
2.0000 [drp] | Freq: Three times a day (TID) | OPHTHALMIC | Status: DC | PRN
  Filled 2014-06-03: qty 15

## 2014-06-03 MED ORDER — CEFAZOLIN SODIUM-DEXTROSE 2-3 GM-% IV SOLR
INTRAVENOUS | Status: AC
  Filled 2014-06-03: qty 50

## 2014-06-03 MED ORDER — SENNOSIDES-DOCUSATE SODIUM 8.6-50 MG PO TABS: 1.0000 | ORAL_TABLET | Freq: Every evening | ORAL | Status: DC | PRN

## 2014-06-03 MED ORDER — MEPERIDINE HCL 25 MG/ML IJ SOLN: 6.2500 mg | INTRAMUSCULAR | Status: DC | PRN

## 2014-06-03 MED ORDER — ONDANSETRON HCL 4 MG/2ML IJ SOLN
INTRAMUSCULAR | Status: DC | PRN
  Administered 2014-06-03: 4 mg via INTRAVENOUS

## 2014-06-03 MED ORDER — DEXAMETHASONE SODIUM PHOSPHATE 4 MG/ML IJ SOLN
INTRAMUSCULAR | Status: AC
  Filled 2014-06-03: qty 1

## 2014-06-03 SURGICAL SUPPLY — 64 items
BANDAGE ESMARK 6X9 LF (GAUZE/BANDAGES/DRESSINGS) ×1 IMPLANT
BLADE SAGITTAL 25.0X1.19X90 (BLADE) ×2 IMPLANT
BNDG CMPR 9X6 STRL LF SNTH (GAUZE/BANDAGES/DRESSINGS) ×1
BNDG ESMARK 6X9 LF (GAUZE/BANDAGES/DRESSINGS) ×2
BOWL SMART MIX CTS (DISPOSABLE) ×2 IMPLANT
CAP KNEE TOTAL 3 SIGMA ×1 IMPLANT
CEMENT HV SMART SET (Cement) ×4 IMPLANT
COVER SURGICAL LIGHT HANDLE (MISCELLANEOUS) ×3 IMPLANT
CUFF TOURNIQUET SINGLE 34IN LL (TOURNIQUET CUFF) IMPLANT
CUFF TOURNIQUET SINGLE 44IN (TOURNIQUET CUFF) ×1 IMPLANT
DRAPE EXTREMITY T 121X128X90 (DRAPE) ×2 IMPLANT
DRAPE IMP U-DRAPE 54X76 (DRAPES) ×2 IMPLANT
DRAPE PROXIMA HALF (DRAPES) ×1 IMPLANT
DRSG ADAPTIC 3X8 NADH LF (GAUZE/BANDAGES/DRESSINGS) ×2 IMPLANT
DRSG PAD ABDOMINAL 8X10 ST (GAUZE/BANDAGES/DRESSINGS) ×4 IMPLANT
DURAPREP 26ML APPLICATOR (WOUND CARE) ×4 IMPLANT
ELECT CAUTERY BLADE 6.4 (BLADE) ×2 IMPLANT
ELECT REM PT RETURN 9FT ADLT (ELECTROSURGICAL) ×2
ELECTRODE REM PT RTRN 9FT ADLT (ELECTROSURGICAL) ×1 IMPLANT
EVACUATOR 1/8 PVC DRAIN (DRAIN) IMPLANT
FACESHIELD WRAPAROUND (MASK) ×4 IMPLANT
FACESHIELD WRAPAROUND OR TEAM (MASK) ×2 IMPLANT
FLOSEAL 10ML (HEMOSTASIS) IMPLANT
GAUZE SPONGE 4X4 12PLY STRL (GAUZE/BANDAGES/DRESSINGS) ×2 IMPLANT
GLOVE BIOGEL PI IND STRL 8 (GLOVE) ×1 IMPLANT
GLOVE BIOGEL PI IND STRL 8.5 (GLOVE) ×1 IMPLANT
GLOVE BIOGEL PI INDICATOR 8 (GLOVE) ×1
GLOVE BIOGEL PI INDICATOR 8.5 (GLOVE) ×1
GLOVE ECLIPSE 8.0 STRL XLNG CF (GLOVE) ×4 IMPLANT
GLOVE SURG ORTHO 8.5 STRL (GLOVE) ×4 IMPLANT
GOWN STRL REUS W/ TWL LRG LVL3 (GOWN DISPOSABLE) ×2 IMPLANT
GOWN STRL REUS W/TWL 2XL LVL3 (GOWN DISPOSABLE) ×2 IMPLANT
GOWN STRL REUS W/TWL LRG LVL3 (GOWN DISPOSABLE) ×4
HANDPIECE INTERPULSE COAX TIP (DISPOSABLE) ×2
KIT BASIN OR (CUSTOM PROCEDURE TRAY) ×2 IMPLANT
KIT ROOM TURNOVER OR (KITS) ×2 IMPLANT
MANIFOLD NEPTUNE II (INSTRUMENTS) ×2 IMPLANT
NEEDLE 22X1 1/2 (OR ONLY) (NEEDLE) ×1 IMPLANT
NS IRRIG 1000ML POUR BTL (IV SOLUTION) ×2 IMPLANT
PACK TOTAL JOINT (CUSTOM PROCEDURE TRAY) ×2 IMPLANT
PACK UNIVERSAL I (CUSTOM PROCEDURE TRAY) ×2 IMPLANT
PAD ABD 8X10 STRL (GAUZE/BANDAGES/DRESSINGS) ×1 IMPLANT
PAD ARMBOARD 7.5X6 YLW CONV (MISCELLANEOUS) ×3 IMPLANT
PAD CAST 4YDX4 CTTN HI CHSV (CAST SUPPLIES) ×1 IMPLANT
PADDING CAST COTTON 4X4 STRL (CAST SUPPLIES) ×2
PADDING CAST COTTON 6X4 STRL (CAST SUPPLIES) ×2 IMPLANT
SET HNDPC FAN SPRY TIP SCT (DISPOSABLE) ×1 IMPLANT
STAPLER VISISTAT 35W (STAPLE) ×2 IMPLANT
SUCTION FRAZIER TIP 10 FR DISP (SUCTIONS) ×2 IMPLANT
SUT BONE WAX W31G (SUTURE) ×2 IMPLANT
SUT ETHIBOND NAB CT1 #1 30IN (SUTURE) ×6 IMPLANT
SUT MNCRL AB 3-0 PS2 18 (SUTURE) ×2 IMPLANT
SUT VIC AB 0 CT1 27 (SUTURE) ×2
SUT VIC AB 0 CT1 27XBRD ANBCTR (SUTURE) ×1 IMPLANT
SUT VIC AB 1 CT1 27 (SUTURE) ×2
SUT VIC AB 1 CT1 27XBRD ANBCTR (SUTURE) ×1 IMPLANT
SYR CONTROL 10ML LL (SYRINGE) ×1 IMPLANT
TOWEL OR 17X24 6PK STRL BLUE (TOWEL DISPOSABLE) ×2 IMPLANT
TOWEL OR 17X26 10 PK STRL BLUE (TOWEL DISPOSABLE) ×2 IMPLANT
TRAY FOLEY CATH 16FRSI W/METER (SET/KITS/TRAYS/PACK) ×1 IMPLANT
UPCHARGE REV TRAY MBT KNEE ×1 IMPLANT
WATER STERILE IRR 1000ML POUR (IV SOLUTION) ×2 IMPLANT
WRAP KNEE MAXI GEL POST OP (GAUZE/BANDAGES/DRESSINGS) ×2 IMPLANT
YANKAUER SUCT BULB TIP NO VENT (SUCTIONS) ×2 IMPLANT

## 2014-06-03 NOTE — Anesthesia Procedure Notes (Signed)
Anesthesia Regional Block:  Femoral nerve block  Pre-Anesthetic Checklist: ,, timeout performed, Correct Patient, Correct Site, Correct Laterality, Correct Procedure, Correct Position, site marked, Risks and benefits discussed,  Surgical consent,  Pre-op evaluation,  At surgeon's request and post-op pain management  Laterality: Right  Prep: Maximum Sterile Barrier Precautions used and chloraprep       Needles:  Injection technique: Single-shot  Needle Type: Echogenic Stimulator Needle     Needle Length: 9cm 9 cm Needle Gauge: 21 and 21 G    Additional Needles:  Procedures: ultrasound guided (picture in chart) Femoral nerve block  Nerve Stimulator or Paresthesia:  Response: 0.5 mA,   Additional Responses:   Narrative:  Start time: 06/03/2014 8:50 AM End time: 06/03/2014 9:00 AM  Additional Notes: R femeral nerve block, [place] and stimulator, marcaine 28ml .5% with epi, multiple asp, talked with patient throughout, nocomplications

## 2014-06-03 NOTE — H&P (Signed)
  The recent History & Physical has been reviewed. I have personally examined the patient today. There is no interval change to the documented History & Physical. The patient would like to proceed with the procedure.  [name], [name] 06/03/2014,  9:26 AM  The recent History & Physical has been reviewed. I have personally examined the patient today. There is no interval change to the documented History & Physical. The patient would like to proceed with the procedure.  [name] 06/03/2014,  9:26 AM

## 2014-06-03 NOTE — Op Note (Signed)
PATIENT ID:      Xiamara Hulet  MRN:     [id number] DOB/AGE:    December 22, 1950 / 63 y.o.       OPERATIVE REPORT    DATE OF PROCEDURE:  06/03/2014       PREOPERATIVE DIAGNOSIS:   RIGHT KNEE END STAGE OSTEOARTHRITIS-MORBID OBESITY BMI 47                                                       Estimated body mass index is 46.9 kg/(m^2) as calculated from the following:   Height as of 05/28/14: 5' 3.5" (1.613 m).   Weight as of this encounter: 122.018 kg (269 lb).     POSTOPERATIVE DIAGNOSIS:   RIGHT KNEE END STAGE OSTEOARTHRITIS-SAME                                                                     Estimated body mass index is 46.9 kg/(m^2) as calculated from the following:   Height as of 05/28/14: 5' 3.5" (1.613 m).   Weight as of this encounter: 122.018 kg (269 lb).     PROCEDURE:  Procedure(s): RIGHT TOTAL KNEE ARTHROPLASTY      SURGEON:  [name], MD    ASSISTANT:   Biagio Borg, PA-C   (Present and scrubbed throughout the case, critical for assistance with exposure, retraction, instrumentation, and closure.)          ANESTHESIA: regional and general     DRAINS: (RIGHT KNEE) Hemovact drain(s) in the CLAMPED with  Suction Clamped :      TOURNIQUET TIME:  Total Tourniquet Time Documented: Thigh (Right) - 84 minutes Total: Thigh (Right) - 84 minutes     COMPLICATIONS:  None   CONDITION:  stable  PROCEDURE IN DETAIL: [id number]   Genevra Orne W 06/03/2014, 12:05 PM

## 2014-06-03 NOTE — Transfer of Care (Signed)
Immediate Anesthesia Transfer of Care Note  Patient: Danielle [name]  Procedure(s) Performed: Procedure(s): RIGHT TOTAL KNEE ARTHROPLASTY (Right)  Patient Location: PACU  Anesthesia Type:General  Level of Consciousness: awake, alert , oriented and patient cooperative  Airway & Oxygen Therapy: Patient Spontanous Breathing and Patient connected to nasal cannula oxygen  Post-op Assessment: Report given to PACU RN, Post -op Vital signs reviewed and stable and Patient moving all extremities X 4  Post vital signs: Reviewed and stable  Complications: No apparent anesthesia complications

## 2014-06-03 NOTE — Anesthesia Postprocedure Evaluation (Signed)
  Anesthesia Post-op Note  Patient: Danielle [name]  Procedure(s) Performed: Procedure(s): RIGHT TOTAL KNEE ARTHROPLASTY (Right)  Patient Location: PACU  Anesthesia Type:General  Level of Consciousness: awake and alert   Airway and Oxygen Therapy: Patient Spontanous Breathing and Patient connected to nasal cannula oxygen  Post-op Pain: mild  Post-op Assessment: Post-op Vital signs reviewed, Patient's Cardiovascular Status Stable, Respiratory Function Stable, Patent Airway and No signs of Nausea or vomiting  Post-op Vital Signs: Reviewed and stable  Last Vitals:  Filed Vitals:   06/03/14 0919  BP:   Pulse: 89  Temp:   Resp: 12    Complications: No apparent anesthesia complications

## 2014-06-03 NOTE — Progress Notes (Signed)
Orthopedic Tech Progress Note Patient Details:  Danielle [name] [birth year] [id number] CPM applied to RLE with appropriate settings. OHF applied. CPM Right Knee CPM Right Knee: On Right Knee Flexion (Degrees): 90 Right Knee Extension (Degrees): 0   Asia R Thompson 06/03/2014, 1:25 PM

## 2014-06-03 NOTE — Plan of Care (Signed)
Problem: Consults Goal: Diagnosis- Total Joint Replacement Outcome: Completed/Met Date Met:  06/03/14 Primary Total Knee Right

## 2014-06-04 ENCOUNTER — Encounter (HOSPITAL_COMMUNITY): Payer: Self-pay | Admitting: General Practice

## 2014-06-04 LAB — BASIC METABOLIC PANEL
Anion gap: 10 (ref 5–15)
BUN: 11 mg/dL (ref 6–23)
CALCIUM: 8.4 mg/dL (ref 8.4–10.5)
CO2: 30 mEq/L (ref 19–32)
Chloride: 104 mEq/L (ref 96–112)
Creatinine, Ser: 0.77 mg/dL (ref 0.50–1.10)
GFR calc Af Amer: >90 mL/min (ref >90)
GFR, EST NON AFRICAN AMERICAN: 88 mL/min — AB (ref >90)
Glucose, Bld: 116 mg/dL — ABNORMAL HIGH (ref 70–99)
Potassium: 3.6 mEq/L — ABNORMAL LOW (ref 3.7–5.3)
SODIUM: 144 meq/L (ref 137–147)

## 2014-06-04 LAB — CBC
HCT: 28.5 % — ABNORMAL LOW (ref 36.0–46.0)
Hemoglobin: 9.3 g/dL — ABNORMAL LOW (ref 12.0–15.0)
MCH: 30.9 pg (ref 26.0–34.0)
MCHC: 32.6 g/dL (ref 30.0–36.0)
MCV: 94.7 fL (ref 78.0–100.0)
PLATELETS: 202 10*3/uL (ref 150–400)
RBC: 3.01 MIL/uL — ABNORMAL LOW (ref 3.87–5.11)
RDW: 13.6 % (ref 11.5–15.5)
WBC: 6.4 10*3/uL (ref 4.0–10.5)

## 2014-06-04 MED ORDER — KETOROLAC TROMETHAMINE 15 MG/ML IJ SOLN
INTRAMUSCULAR | Status: AC
  Filled 2014-06-04: qty 1

## 2014-06-04 NOTE — Progress Notes (Signed)
Utilization review completed.  

## 2014-06-04 NOTE — Progress Notes (Signed)
Patient ID: Danielle [name], female   DOB: 1951/06/03, 63 y.o.   MRN: [id number] PATIENT ID: Danielle [name]        MRN:  [id number]          DOB/AGE: Sep 10, 1950 / 63 y.o.    [name], MD   Biagio Borg, PA-C [address] [place], [place]  [address]                             [phone]   PROGRESS NOTE  Subjective:  negative for Chest Pain  negative for Shortness of Breath  negative for Nausea/Vomiting   negative for Calf Pain    Tolerating Diet: yes         Patient reports pain as mild and moderate.     Minimal pain.  Objective: Vital signs in last 24 hours:   Patient Vitals for the past 24 hrs:  BP Temp Temp src Pulse Resp SpO2  06/04/14 0552 (!) 123/56 mmHg 97.8 F (36.6 C) - 78 14 98 %  06/04/14 0400 - - - - 16 99 %  06/04/14 0133 (!) 120/55 mmHg 97.7 F (36.5 C) - 65 16 99 %  06/04/14 0000 - - - - 16 97 %  06/03/14 2200 (!) 129/58 mmHg 97.7 F (36.5 C) - 68 16 97 %  06/03/14 2000 - - - - 16 97 %  06/03/14 1852 (!) 122/56 mmHg 98.1 F (36.7 C) Oral 66 16 100 %  06/03/14 1825 131/64 mmHg - - 77 12 96 %  06/03/14 1755 130/74 mmHg - - 72 13 96 %  06/03/14 1730 124/63 mmHg - - (!) 49 12 92 %  06/03/14 1700 (!) 130/59 mmHg - - (!) 51 11 92 %  06/03/14 1630 123/61 mmHg - - (!) 57 13 100 %  06/03/14 1600 139/88 mmHg - - 69 13 99 %  06/03/14 1530 129/77 mmHg - - (!) 59 12 98 %  06/03/14 1500 130/63 mmHg - - (!) 48 11 100 %  06/03/14 1430 (!) 118/94 mmHg - - (!) 45 11 100 %  06/03/14 1400 135/68 mmHg - - (!) 57 11 100 %  06/03/14 1330 130/67 mmHg 97.1 F (36.2 C) - (!) 49 12 100 %  06/03/14 1315 136/64 mmHg - - - - -  06/03/14 1300 135/75 mmHg - - 65 11 98 %  06/03/14 1245 131/73 mmHg - - 74 13 100 %  06/03/14 1238 (!) 146/69 mmHg 97.3 F (36.3 C) - 69 13 100 %  06/03/14 0919 - - - 89 12 100 %  06/03/14 0916 - - - 93 16 100 %  06/03/14 0914 (!) 129/51 mmHg - - 89 14 100 %  06/03/14 0913 - - - 83 14 100 %  06/03/14 0910 (!) 130/52 mmHg - - 80 12 100 %    06/03/14 0908 - - - 84 13 100 %  06/03/14 0906 - - - 87 13 100 %  06/03/14 0904 (!) 129/50 mmHg - - 86 14 100 %  06/03/14 0903 - - - 84 13 100 %  06/03/14 0901 - - - 80 12 100 %  06/03/14 0859 (!) 146/58 mmHg - - 90 13 100 %  06/03/14 0857 - - - 88 17 100 %  06/03/14 0856 - - - 78 11 100 %  06/03/14 0854 132/67 mmHg - - 76 13 100 %  06/03/14 0853 - - - 71 (!)  9 100 %  06/03/14 0851 - - - 82 17 100 %  06/03/14 0849 133/65 mmHg - - 75 11 99 %  06/03/14 0848 - - - 80 13 98 %  06/03/14 0846 - - - 78 14 100 %  06/03/14 0845 - - - 75 15 100 %  06/03/14 0844 140/62 mmHg - - 79 17 100 %  06/03/14 0839 133/70 mmHg - - 86 14 100 %  06/03/14 0836 - - - 78 13 100 %  06/03/14 0834 - - - 83 16 99 %      Intake/Output from previous day:   12/15 0701 - 12/16 0700 In: 3545 [P.O.:720; I.V.:2825] Out: 1350 [Urine:1100; Drains:200]   Intake/Output this shift:       Intake/Output      12/15 0701 - 12/16 0700 12/16 0701 - 12/17 0700   P.O. 720    I.V. (mL/kg) 2825 (23.2)    Total Intake(mL/kg) 3545 (29.1)    Urine (mL/kg/hr) 1100    Drains 200    Blood 50    Total Output 1350     Net +2195             LABORATORY DATA:  Recent Labs  05/28/14 0859 06/04/14 0530  WBC 5.2 6.4  HGB 13.0 9.3*  HCT 39.3 28.5*  PLT 188 202    Recent Labs  05/28/14 0859 06/04/14 0530  NA 142 144  K 3.4* 3.6*  CL 101 104  CO2 28 30  BUN 15 11  CREATININE 0.84 0.77  GLUCOSE 94 116*  CALCIUM 9.4 8.4   Lab Results  Component Value Date   INR 1.04 05/28/2014    Recent Radiographic Studies :  Dg Chest 2 View  05/28/2014   CLINICAL DATA:  For right total knee arthroplasty, history of asthma  EXAM: CHEST  2 VIEW  COMPARISON:  None.  FINDINGS: The lungs are clear and slightly hyperaerated. Mediastinal and hilar contours appear normal. The heart is within normal limits in size. There are degenerative changes diffusely throughout the thoracic spine.  IMPRESSION: No active cardiopulmonary disease.   Slight hyper aeration.   Electronically Signed   By: Ivar Drape M.D.   On: 05/28/2014 09:42     Examination:  General appearance: alert, cooperative, mild distress and moderate distress Resp: clear to auscultation bilaterally Cardio: regular rate and rhythm GI: normal findings: bowel sounds normal  Wound Exam: clean, dry, intact   Drainage:  None: wound tissue dry  Motor Exam: EHL, FHL, Anterior Tibial and Posterior Tibial Intact  Sensory Exam: Superficial Peroneal, Deep Peroneal and Tibial normal  Vascular Exam: Right dorsalis pedis artery has 1+ (weak) pulse  Assessment:    1 Day Post-Op  Procedure(s) (LRB): RIGHT TOTAL KNEE ARTHROPLASTY (Right)  ADDITIONAL DIAGNOSIS:  Active Problems:   S/P total knee replacement using cement     Plan: Physical Therapy as ordered PWB 50%  DVT Prophylaxis:  Xarelto, Foot Pumps and TED hose  DISCHARGE PLAN: Skilled Nursing Facility/Rehab [place]  DISCHARGE NEEDS: HHPT, CPM, Walker and 3-in-1 comode seat        PETRARCA,Danielle  06/04/2014 8:11 AM

## 2014-06-04 NOTE — Op Note (Signed)
NAMEVIDHI, DELELLIS NO.:  [id number]  MEDICAL RECORD NO.:  [id number]  LOCATION:  5N02C                        FACILITY:  [facility]  PHYSICIAN:  Vonna Kotyk. [place] Natarajan, M.D.DATE OF BIRTH:  1951/01/25  DATE OF PROCEDURE:  06/03/2014 DATE OF DISCHARGE:                              OPERATIVE REPORT   PREOPERATIVE DIAGNOSES: 1. End-stage osteoarthritis, right knee. 2. Morbid obesity with a body mass index of 47.  POSTOPERATIVE DIAGNOSES: 1. End-stage osteoarthritis, right knee. 2. Morbid obesity with a body mass index of 47.  PROCEDURE:  Right total knee replacement.  SURGEON:  Vonna Kotyk. Durward Fortes, MD  ASSISTANT:  Mike Craze. Petrarca, PA-C  ANESTHESIA:  General with supplemental femoral nerve block.  COMPLICATIONS:  None.  COMPONENTS:  DePuy LCS standard femoral component,  #3 rotating revision keeled tibial tray with a 10-mm polyethylene bridging, bearing, and metal-backed 3 peg patella, all components were secured with polymethyl methacrylate.  PROCEDURE IN DETAIL:  Ms. Picinich was met in the holding area, identified the right knee as appropriate operative site margin accordingly. Anesthesia performed preoperative femoral nerve block.  The patient was then transported to room #7 and placed under general anesthesia without difficulty.  Nursing staff inserted a Foley catheter.  Urine was clear.  The right leg was then placed in a thigh tourniquet and the leg was prepped with chlorhexidine scrub and DuraPrep x2 from the tourniquet to the tips of the toes.  Sterile draping was performed.  Time-out was called.  The extremity was elevated and Esmarch exsanguinated with a proximal tourniquet at 350 mmHg.  Midline longitudinal incision was made, centered about the patella extending from the superior pouch to tibial tubercle via sharp dissection, incision was carried down to subcutaneous tissue.  There was abundant adipose tissue that was incised to the layer of the  first capsule and medial parapatellar incision was made with a Bovie to the deep capsule.  The joint was entered.  There was a small clear yellow joint effusion and the patella was everted 180 degrees.  The knee flexed to 90 degrees.  There was a moderate amount of synovitis that was somewhat pale, pink in color.  Synovectomy was performed.  There were large osteophytes along the medial and lateral femoral condyle and medial tibial plateau, and these were removed as well.  There was a fixed varus position and fixed flexion contracture.  BT release was performed.  I sized a standard femoral component.  With the knee aligned neutrally, external tibial jig was then applied. I elected to use the revision tibial tray based on the patient's size and accordingly a transverse cut was made in the tibia with a 3 degree angle of declination.  After each bony cut on the tibia and femur, I used the external tibial guide to assure appropriate alignment. Subsequent cuts were then made on the femur using the standard femoral jig.  Flexion-extension gaps were symmetrical at 10 mm.  MCL and LCL remained intact. Lamina spreader was inserted along the medial lateral compartment so that I could remove medial and lateral menisci as well as ACL and PCL. Large posterior femoral condyle osteophytes removed with a 3/4-inch curved osteotome.  Final cuts were then made on the femur for tapering purposes.  I did use a 4-degree distal femoral valgus cut.  Retractors were placed about the tibia, was advanced anteriorly and measured a #3 tibial tray, this was pinned in place.  Center hole was then made followed by the extended tibial hole for the revision tray. The revision trial tray was then inserted that was to check the alignment and then keeled  cut was then made.  The tibial tray in place, I inserted the 10-mm polyethylene bridging bearing followed by the standard trial femoral component.  The entire  construct was reduced and through full range of motion had full extension, flexed to over 100 degrees, at which point, the thigh would touch calf.  There was no opening with varus or valgus stress and negative anterior drawer sign.  The patella was then prepared by removing 10 mm of bone leaving approximately 13 mm of patellar thickness.  The patellar jig was applied, 3 holes made and trial patella inserted, it did sublux laterally.  As a lateral capsule was tightened, I had performed a lateral release with the Bovie.  At that point, the patella tracked in the midline.  The trial components were then removed.  The joint was copiously irrigated with saline solution.  The final components were impacted with polymethyl methacrylate.  We initially inserted the revision tibial tray followed by the 10-mm bridging bearing and then the standard femoral component.  The components were impacted and then the knee was placed in extension. Extraneous methacrylate was removed with a Valora Corporal. Patella was applied with the same methacrylate and a patellar clamp.  At approximately 16 minutes, the methacrylate had matured and was hardened. During this time, we injected the deep capsule with 0.25% Marcaine with epinephrine.  The joint was irrigated with saline solution.  Tourniquet was deflated at 84 minutes.  Gross bleeders were Bovie coagulated.  There was some bleeding from the posterior capsule, I did use thrombin spray.  Medium- size Hemovac was placed in the lateral compartment.  The deep capsule was then closed with a 0 Ethibond suture.  The superficial capsule was closed with running 0 Vicryl, subcu, and several layers with 2-0 Vicryl and 3-0 Monocryl.  Skin was closed with skin clips.  Sterile bulky dressing was applied followed by the patient's support stocking.  The patient was awoken and returned to the postanesthesia recovery room without complications.     Vonna Kotyk. Durward Fortes,  M.D.     PWW/MEDQ  D:  06/03/2014  T:  06/04/2014  Job:  [id number]

## 2014-06-04 NOTE — Progress Notes (Signed)
Orthopedic Tech Progress Note Patient Details:  Danielle [name] February 13, 1951 [id number]  Patient ID: [name], female   DOB: [birth year], 63 y.o.   MRN: [id number] Placed pt's rle in cpm @ 0-60 degrees @1435   Hildred Priest 06/04/2014, 2:35 PM

## 2014-06-04 NOTE — Clinical Social Work Note (Signed)
CSW received consult for SNF placement.  Per report, patient pre-registered at [facility] in [place].  Please note, no FL2/PASSAR will be needed for out of state placement.   CSW now following  Nonnie Done, [place] [phone]  Psychiatric & Orthopedics (5N 1-16) Clinical Social Worker

## 2014-06-04 NOTE — Evaluation (Signed)
Physical Therapy Evaluation Patient Details Name: Danielle [name] MRN: [id number] DOB: Jun 19, 1951 Today's Date: 06/04/2014   History of Present Illness  Pt is a 63 y.o. female s/p Rt TKA 06/03/14.  Clinical Impression  Pt is s/p Rt TKA POD#1 resulting in the deficits listed below (see PT Problem List). Pt will benefit from skilled PT to increase their independence and safety with mobility to allow discharge to the venue listed below. Pt unable to safely assess transfers today due to Rt LE buckling and pt losing balance posteriorly and returning to EOB without warning. Pt will need SNF for post acute rehab upon D/C due to incr caregiver (A) needed at this time.      Follow Up Recommendations SNF    Equipment Recommendations  Other (comment) (TBD at SNF)    Recommendations for Other Services OT consult     Precautions / Restrictions Precautions Precautions: Fall;Knee Precaution Comments: pt reports she has vertigo at times; has fallen as a result Restrictions Weight Bearing Restrictions: Yes RLE Weight Bearing: Partial weight bearing RLE Partial Weight Bearing Percentage or Pounds: 50%      Mobility  Bed Mobility Overal bed mobility: Needs Assistance Bed Mobility: Rolling;Supine to Sit;Sit to Supine Rolling: Supervision   Supine to sit: Min assist;HOB elevated Sit to supine: Min assist;HOB elevated   General bed mobility comments: cues for sequencing (A) to advance Rt LE to/off EOB and back into supine position; pt rolled to place bedpan under pt; pt relying on handrails for bed mobility   Transfers Overall transfer level: Needs assistance Equipment used: Rolling walker (2 wheeled) Transfers: Sit to/from Stand Sit to Stand: +2 physical assistance;Max assist         General transfer comment: attempted sit to stand in preparation for SPT; pt able achieve sit to stand with 2 person (A) but unable to maintain standing due to Rt knee buckling; pt returning to sitting EOB after  <1 min of standing x 2 trials; pt with difficulty follow commands to power up through UEs and Lt LE   Ambulation/Gait             General Gait Details: unable to safely assess this session due to Rt knee buckling  Stairs            Wheelchair Mobility    Modified Rankin (Stroke Patients Only)       Balance Overall balance assessment: Needs assistance;History of Falls Sitting-balance support: Feet supported;No upper extremity supported Sitting balance-Leahy Scale: Fair Sitting balance - Comments: sat EOB; no c/o dizziness   Standing balance support: During functional activity;Bilateral upper extremity supported Standing balance-Leahy Scale: Zero Standing balance comment: 2 person (A); Rt knee buckling; pt with LOB and lean posteriorly                              Pertinent Vitals/Pain Pain Assessment: No/denies pain    Home Living Family/patient expects to be discharged to:: Skilled nursing facility Living Arrangements: Other relatives               Additional Comments: Lives with sister; plans to D/C to [facility]    Prior Function Level of Independence: Independent with assistive device(s)         Comments: pt ambulating with cane due to pain PTA     Hand Dominance        Extremity/Trunk Assessment   Upper Extremity Assessment: Defer to OT evaluation  Lower Extremity Assessment: RLE deficits/detail RLE Deficits / Details: AROM in sitting -5 to 60 degreees in sitting; c/o numbness in Rt LE     Cervical / Trunk Assessment: Normal  Communication   Communication: No difficulties  Cognition Arousal/Alertness: Awake/alert Behavior During Therapy: WFL for tasks assessed/performed Overall Cognitive Status: Within Functional Limits for tasks assessed                      General Comments General comments (skin integrity, edema, etc.): pt placed on bedpan; RN made aware     Exercises Total Joint  Exercises Ankle Circles/Pumps: AROM;Both;10 reps;Supine      Assessment/Plan    PT Assessment Patient needs continued PT services  PT Diagnosis Difficulty walking;Generalized weakness;Acute pain   PT Problem List Decreased strength;Decreased range of motion;Decreased activity tolerance;Decreased balance;Decreased mobility;Decreased knowledge of use of DME;Decreased safety awareness;Decreased knowledge of precautions;Pain  PT Treatment Interventions DME instruction;Gait training;Functional mobility training;Therapeutic activities;Therapeutic exercise;Balance training;Neuromuscular re-education;Patient/family education   PT Goals (Current goals can be found in the Care Plan section) Acute Rehab PT Goals Patient Stated Goal: to do more PT Goal Formulation: With patient Time For Goal Achievement: 06/11/14 Potential to Achieve Goals: Good    Frequency 7X/week   Barriers to discharge Decreased caregiver support stays with sister,who is unable to provide physical (A); plans to D/C to SNF    Co-evaluation               End of Session Equipment Utilized During Treatment: Gait belt Activity Tolerance: Patient limited by fatigue Patient left: in bed;with call bell/phone within reach Nurse Communication: Mobility status;Precautions;Weight bearing status         Time: 0349-1791 PT Time Calculation (min) (ACUTE ONLY): 17 min   Charges:   PT Evaluation $Initial PT Evaluation Tier I: 1 Procedure PT Treatments $Therapeutic Activity: 8-22 mins   PT G CodesGustavus Bryant, [place]  [phone] 06/04/2014, 10:18 AM

## 2014-06-05 DIAGNOSIS — M1711 Unilateral primary osteoarthritis, right knee: Secondary | ICD-10-CM | POA: Diagnosis present

## 2014-06-05 DIAGNOSIS — Z9889 Other specified postprocedural states: Secondary | ICD-10-CM

## 2014-06-05 LAB — CBC
HCT: 29.3 % — ABNORMAL LOW (ref 36.0–46.0)
HEMOGLOBIN: 9.6 g/dL — AB (ref 12.0–15.0)
MCH: 31.7 pg (ref 26.0–34.0)
MCHC: 32.8 g/dL (ref 30.0–36.0)
MCV: 96.7 fL (ref 78.0–100.0)
Platelets: 144 10*3/uL — ABNORMAL LOW (ref 150–400)
RBC: 3.03 MIL/uL — ABNORMAL LOW (ref 3.87–5.11)
RDW: 13.8 % (ref 11.5–15.5)
WBC: 5.6 10*3/uL (ref 4.0–10.5)

## 2014-06-05 LAB — BASIC METABOLIC PANEL
ANION GAP: 11 (ref 5–15)
BUN: 12 mg/dL (ref 6–23)
CALCIUM: 8.3 mg/dL — AB (ref 8.4–10.5)
CO2: 29 mEq/L (ref 19–32)
Chloride: 100 mEq/L (ref 96–112)
Creatinine, Ser: 0.69 mg/dL (ref 0.50–1.10)
Glucose, Bld: 112 mg/dL — ABNORMAL HIGH (ref 70–99)
POTASSIUM: 3.3 meq/L — AB (ref 3.7–5.3)
Sodium: 140 mEq/L (ref 137–147)

## 2014-06-05 MED ORDER — RIVAROXABAN 10 MG PO TABS: 10.0000 mg | ORAL_TABLET | Freq: Every day | ORAL | Status: DC

## 2014-06-05 MED ORDER — POTASSIUM CHLORIDE CRYS ER 20 MEQ PO TBCR
40.0000 meq | EXTENDED_RELEASE_TABLET | Freq: Once | ORAL | Status: AC
  Administered 2014-06-05: 40 meq via ORAL
  Filled 2014-06-05: qty 2

## 2014-06-05 MED ORDER — METHOCARBAMOL 500 MG PO TABS: 500.0000 mg | ORAL_TABLET | Freq: Three times a day (TID) | ORAL | Status: DC | PRN

## 2014-06-05 MED ORDER — OXYCODONE HCL 5 MG PO TABS: 5.0000 mg | ORAL_TABLET | ORAL | Status: DC | PRN

## 2014-06-05 NOTE — Clinical Social Work Note (Addendum)
Patient to dc today to [facility] SNF in [place] per MD order RN to call report to [phone] prior to transportation Transportation: PTAR NO FL2/PASARR needed for out of state ([place]) placement.    CSW met with patient.  Patient is aware and agreeable to these plans.  Liaison also met with patient at bedside to review admission information.  CSW awaits return call from SNF with confirmation of bed readiness.  Nonnie Done, [place] [phone]  Psychiatric & Orthopedics (5N 1-16) Clinical Social Worker

## 2014-06-05 NOTE — Discharge Summary (Signed)
Danielle Fears, MD   Biagio Borg, PA-C [address] [place], [place]  [address]                             [phone]  PATIENT ID: Danielle [name]        MRN:  [id number]          DOB/AGE: October 15, 1950 / 63 y.o.    DISCHARGE SUMMARY  ADMISSION DATE:    06/03/2014 DISCHARGE DATE:   06/05/2014   ADMISSION DIAGNOSIS: RIGHT KNEE END STAGE PRIMARY OSTEOARTHRITIS    DISCHARGE DIAGNOSIS:  RIGHT KNEE END STAGE PRIMARY OSTEOARTHRITIS    ADDITIONAL DIAGNOSIS: Principal Problem:   Primary osteoarthritis of right knee Active Problems:   S/P total knee replacement using cement  Past Medical History  Diagnosis Date  . Hypertension   . Asthma     seasonal  . GERD (gastroesophageal reflux disease)     occ  . Arthritis     PROCEDURE: Procedure(s): RIGHT TOTAL KNEE ARTHROPLASTY Right on 06/03/2014  CONSULTS: none     HISTORY: Danielle [name] is a very pleasant 63 year old white female who is seen today for evaluation of her right knee. She has had about 10 years of problems with the knee. She has been treated by a physician, Dr. Tamera Punt, in [place] and has had several cortisone injections and Euflexxa injections about 3 years ago. She states her pain is about 8/10 now. She had tried physical therapy for a fairly good period of time last year. She has been on Relafen, Voltaren, and several cortisone injections. She has even tried the diclofenac or Voltaren gel. She states that she has pretty much pain with every step, some nighttime pain. She does have some catching and is starting to have problems with her activities of daily living. She can almost do her light housework but, other than that, she is pretty much unable to do it. She walks with a cane at this time. She is continuing again having popping/grinding symptoms and trouble with her balance secondary to her pain  HOSPITAL COURSE:  Danielle [name] is a 63 y.o. admitted on 06/03/2014 and found to have a diagnosis of [place].  After appropriate laboratory studies were obtained  they were taken to the operating room on 06/03/2014 and underwent  Procedure(s): RIGHT TOTAL KNEE ARTHROPLASTY  .   They were given perioperative antibiotics:  Anti-infectives    Start     Dose/Rate Route Frequency Ordered Stop   06/03/14 1556  ceFAZolin (ANCEF) 2-3 GM-% IVPB SOLR    Comments:  Reynolds, Lauren   : cabinet override      06/03/14 1556 06/04/14 0359   06/03/14 1530  ceFAZolin (ANCEF) IVPB 2 g/50 mL premix     2 g100 mL/hr over 30 Minutes Intravenous Every 6 hours 06/03/14 1522 06/03/14 2121   06/02/14 1344  ceFAZolin (ANCEF) 3 g in dextrose 5 % 50 mL IVPB     3 g160 mL/hr over 30 Minutes Intravenous 60 min pre-op 06/02/14 1344 06/03/14 1010    .  Tolerated the procedure well.  Placed with a foley intraoperatively.    Toradol was given post op.  POD #1, allowed out of bed to a chair.  PT for ambulation and exercise program.  Foley D/C'd in morning.  IV saline locked.  O2 discontionued. Hemovac pulled.  POD #2, continued PT and ambulation.  Some calf pain, doppler ordered and reported  as negative.  Hypokalemia and was given KCL 40 meq prior to discharge.  The remainder of the hospital course was dedicated to ambulation and strengthening.   The patient was discharged on 2 Days Post-Op in  Stable condition.  Blood products given:none  DIAGNOSTIC STUDIES: Recent vital signs:  Patient Vitals for the past 24 hrs:  BP Temp Temp src Pulse Resp SpO2 Height Weight  06/05/14 0530 136/60 mmHg 99.2 F (37.3 C) Other 93 18 93 % 5\' 3"  (1.6 m) 125 kg (275 lb 9.2 oz)  06/05/14 0400 - - - - 18 - - -  06/05/14 0000 - - - - 18 - - -  06/04/14 2126 (!) 119/47 mmHg 98.6 F (37 C) Oral 99 18 94 % - -  06/04/14 2000 - - - - 16 - - -  06/04/14 1958 - - - - - 99 % - -  06/04/14 1400 (!) 100/53 mmHg 98.2 F (36.8 C) - 83 16 99 % - -  06/04/14 1200 - - - - 16 99 % - -       Recent laboratory studies:  Recent  Labs  06/04/14 0530 06/05/14 0548  WBC 6.4 5.6  HGB 9.3* 9.6*  HCT 28.5* 29.3*  PLT 202 144*    Recent Labs  06/04/14 0530 06/05/14 0548  NA 144 140  K 3.6* 3.3*  CL 104 100  CO2 30 29  BUN 11 12  CREATININE 0.77 0.69  GLUCOSE 116* 112*  CALCIUM 8.4 8.3*   Lab Results  Component Value Date   INR 1.04 05/28/2014     Recent Radiographic Studies :  Dg Chest 2 View  05/28/2014   CLINICAL DATA:  For right total knee arthroplasty, history of asthma  EXAM: CHEST  2 VIEW  COMPARISON:  None.  FINDINGS: The lungs are clear and slightly hyperaerated. Mediastinal and hilar contours appear normal. The heart is within normal limits in size. There are degenerative changes diffusely throughout the thoracic spine.  IMPRESSION: No active cardiopulmonary disease.  Slight hyper aeration.   Electronically Signed   By: Ivar Drape M.D.   On: 05/28/2014 09:42    DISCHARGE INSTRUCTIONS:     Discharge Instructions    CPM    Complete by:  As directed   Continuous passive motion machine (CPM):      Use the CPM from 0 to 60 for 6-8 hours per day.      You may increase by 5-10 per day.  You may break it up into 2 or 3 sessions per day.      Use CPM for 3-4 weeks or until you are told to stop.     Call MD / Call 911    Complete by:  As directed   If you experience chest pain or shortness of breath, CALL 911 and be transported to the hospital emergency room.  If you develop a fever above 101 F, pus (white drainage) or increased drainage or redness at the wound, or calf pain, call your surgeon's office.     Change dressing    Complete by:  As directed   DO NOT CHANGE DRESSING     Constipation Prevention    Complete by:  As directed   Drink plenty of fluids.  Prune juice and/or coffee may be helpful.  You may use a stool softener, such as Colace (over the counter) 100 mg twice a day.  Use MiraLax (over the counter) for constipation as needed but this  may take several days to work.  Mag Citrate --OR--  Milk of Magnesia --OR -- Dulcolax pills/suppositories may also be used but follow directions on the label.     Diet general    Complete by:  As directed      Discharge instructions    Complete by:  As directed   YOU WERE GIVEN A DEVICE CALLED AN INCENTIVE SPIROMETER TO HELP YOU TAKE DEEP BREATHS.  PLEASE USE THIS AT LEAST TEN (10) TIMES EVERY 1-2 HOURS EVERY DAY TO PREVENT PNEUMONIA.     Do not put a pillow under the knee. Place it under the heel.    Complete by:  As directed      Driving restrictions    Complete by:  As directed   No driving for 6 weeks     Increase activity slowly as tolerated    Complete by:  As directed      Lifting restrictions    Complete by:  As directed   No lifting for 6 weeks     Partial weight bearing    Complete by:  As directed   50 % WEIGHT BEARING AS TAUGHT IN PHYSICAL THERAPY     Patient may shower    Complete by:  As directed   You may shower over the brown dressing.  .     TED hose    Complete by:  As directed   Use stockings (TED hose) for 2 weeks on operative leg(s).  You may remove them at night for sleeping.           DISCHARGE MEDICATIONS:     Medication List    STOP taking these medications        nabumetone 750 MG tablet  Commonly known as:  RELAFEN      TAKE these medications        acetaminophen 325 MG tablet  Commonly known as:  TYLENOL  Take 650 mg by mouth as needed.     albuterol 108 (90 BASE) MCG/ACT inhaler  Commonly known as:  PROVENTIL HFA;VENTOLIN HFA  Inhale 2 puffs into the lungs every 6 (six) hours as needed for wheezing or shortness of breath.     cetirizine 10 MG tablet  Commonly known as:  ZYRTEC  Take 10 mg by mouth daily as needed for allergies.     fluticasone 110 MCG/ACT inhaler  Commonly known as:  FLOVENT HFA  Inhale 2 puffs into the lungs 2 (two) times daily.     hydrochlorothiazide 25 MG tablet  Commonly known as:  HYDRODIURIL  Take 25 mg by mouth daily.     hydrocortisone 2.5 % cream    Apply 1 application topically 2 (two) times daily as needed.     hydroxypropyl methylcellulose / hypromellose 2.5 % ophthalmic solution  Commonly known as:  ISOPTO TEARS / GONIOVISC  Place 2 drops into both eyes 3 (three) times daily as needed for dry eyes.     methocarbamol 500 MG tablet  Commonly known as:  ROBAXIN  Take 1 tablet (500 mg total) by mouth every 8 (eight) hours as needed for muscle spasms.     oxyCODONE 5 MG immediate release tablet  Commonly known as:  Oxy IR/ROXICODONE  Take 1-2 tablets (5-10 mg total) by mouth every 4 (four) hours as needed for moderate pain, severe pain or breakthrough pain.     rivaroxaban 10 MG Tabs tablet  Commonly known as:  XARELTO  Take 1 tablet (10 mg total)  by mouth daily with breakfast.        FOLLOW UP VISIT:   Follow-up Information    Follow up with [facility], Vonna Kotyk, MD. Schedule an appointment as soon as possible for a visit on 06/18/2014.   Specialty:  Orthopedic Surgery   Contact information:   [phone] [phone]       DISPOSITION:   Skilled Nursing Facility/Rehab [place]  CONDITION:  Stable   Mike Craze. [place], [place] [phone]  06/05/2014 10:25 AM

## 2014-06-05 NOTE — Discharge Instructions (Signed)
Information on my medicine - XARELTO® (Rivaroxaban) ° °This medication education was reviewed with me or my healthcare representative as part of my discharge preparation.  The pharmacist that spoke with me during my hospital stay was:  Vernal Hritz Prescott, RPH ° °Why was Xarelto® prescribed for you? °Xarelto® was prescribed for you to reduce the risk of blood clots forming after orthopedic surgery. The medical term for these abnormal blood clots is venous thromboembolism (VTE). ° °What do you need to know about xarelto® ? °Take your Xarelto® ONCE DAILY at the same time every day. °You may take it either with or without food. ° °If you have difficulty swallowing the tablet whole, you may crush it and mix in applesauce just prior to taking your dose. ° °Take Xarelto® exactly as prescribed by your doctor and DO NOT stop taking Xarelto® without talking to the doctor who prescribed the medication.  Stopping without other VTE prevention medication to take the place of Xarelto® may increase your risk of developing a clot. ° °After discharge, you should have regular check-up appointments with your healthcare provider that is prescribing your Xarelto®.   ° °What do you do if you miss a dose? °If you miss a dose, take it as soon as you remember on the same day then continue your regularly scheduled once daily regimen the next day. Do not take two doses of Xarelto® on the same day.  ° °Important Safety Information °A possible side effect of Xarelto® is bleeding. You should call your healthcare provider right away if you experience any of the following: °? Bleeding from an injury or your nose that does not stop. °? Unusual colored urine (red or dark brown) or unusual colored stools (red or black). °? Unusual bruising for unknown reasons. °? A serious fall or if you hit your head (even if there is no bleeding). ° °Some medicines may interact with Xarelto® and might increase your risk of bleeding while on Xarelto®. To help avoid  this, consult your healthcare provider or pharmacist prior to using any new prescription or non-prescription medications, including herbals, vitamins, non-steroidal anti-inflammatory drugs (NSAIDs) and supplements. ° °This website has more information on Xarelto®: www.xarelto.com. ° ° ° °

## 2014-06-05 NOTE — Progress Notes (Signed)
*  Preliminary Results* Right lower extremity venous duplex completed. Right lower extremity is negative for deep vein thrombosis. There is no evidence of right Baker's cyst.  06/05/2014 8:52 AM  Maudry Mayhew, RVT, RDCS, RDMS

## 2014-06-05 NOTE — Progress Notes (Signed)
Patient ID: Sahar Ryback, female   DOB: 28-May-1951, 63 y.o.   MRN: [id number] PATIENT ID: Venecia Mehl        MRN:  [id number]          DOB/AGE: 05/05/1951 / 63 y.o.    [name], MD   Biagio Borg, PA-C [address] [place], [place]  [address]                             [phone]   PROGRESS NOTE  Subjective:  negative for Chest Pain  negative for Shortness of Breath  negative for Nausea/Vomiting   positive for Calf Pain very mild at best   Tolerating Diet: yes         Patient reports pain as mild and moderate.       Objective: Vital signs in last 24 hours:   Patient Vitals for the past 24 hrs:  BP Temp Temp src Pulse Resp SpO2 Height Weight  06/05/14 0530 136/60 mmHg 99.2 F (37.3 C) Other 93 18 93 % 5\' 3"  (1.6 m) 125 kg (275 lb 9.2 oz)  06/05/14 0400 - - - - 18 - - -  06/05/14 0000 - - - - 18 - - -  06/04/14 2126 (!) 119/47 mmHg 98.6 F (37 C) Oral 99 18 94 % - -  06/04/14 2000 - - - - 16 - - -  06/04/14 1958 - - - - - 99 % - -  06/04/14 1400 (!) 100/53 mmHg 98.2 F (36.8 C) - 83 16 99 % - -  06/04/14 1200 - - - - 16 99 % - -  06/04/14 0800 - - - - 16 98 % - -      Intake/Output from previous day:   12/16 0701 - 12/17 0700 In: 600 [P.O.:600] Out: 1450 [Urine:1300; Drains:150]   Intake/Output this shift:       Intake/Output      12/16 0701 - 12/17 0700 12/17 0701 - 12/18 0700   P.O. 600    I.V. (mL/kg)     Total Intake(mL/kg) 600 (4.8)    Urine (mL/kg/hr) 1300 (0.4)    Drains 150 (0.1)    Blood     Total Output 1450     Net -850          Urine Occurrence 2 x       LABORATORY DATA:  Recent Labs  06/04/14 0530 06/05/14 0548  WBC 6.4 5.6  HGB 9.3* 9.6*  HCT 28.5* 29.3*  PLT 202 144*    Recent Labs  06/04/14 0530 06/05/14 0548  NA 144 140  K 3.6* 3.3*  CL 104 100  CO2 30 29  BUN 11 12  CREATININE 0.77 0.69  GLUCOSE 116* 112*  CALCIUM 8.4 8.3*   Lab Results  Component Value Date   INR 1.04 05/28/2014    Recent  Radiographic Studies :  Dg Chest 2 View  05/28/2014   CLINICAL DATA:  For right total knee arthroplasty, history of asthma  EXAM: CHEST  2 VIEW  COMPARISON:  None.  FINDINGS: The lungs are clear and slightly hyperaerated. Mediastinal and hilar contours appear normal. The heart is within normal limits in size. There are degenerative changes diffusely throughout the thoracic spine.  IMPRESSION: No active cardiopulmonary disease.  Slight hyper aeration.   Electronically Signed   By: Ivar Drape M.D.   On: 05/28/2014 09:42     Examination:  General appearance: alert, cooperative, mild distress and moderate distress  Wound Exam: clean, dry, intact   Drainage:  None: wound tissue dry  Motor Exam: EHL, FHL, Anterior Tibial and Posterior Tibial Intact  Sensory Exam: Superficial Peroneal, Deep Peroneal and Tibial normal  Vascular Exam: Right dorsalis pedis artery has trace pulse  Assessment:    2 Days Post-Op  Procedure(s) (LRB): RIGHT TOTAL KNEE ARTHROPLASTY (Right)  ADDITIONAL DIAGNOSIS:  Active Problems:   S/P total knee replacement using cement  Hypokalemia   Plan: Physical Therapy as ordered Partial Weight Bearing @ 50% (PWB)  DVT Prophylaxis:  Xarelto, Foot Pumps and TED hose  DISCHARGE PLAN: Skilled Nursing Facility/Rehab [place] today if bed available  DISCHARGE NEEDS: HHPT, CPM, Walker and 3-in-1 comode seat   Doppler to r/o DVT  KCL for decreased K        PETRARCA,BRIAN  06/05/2014 7:15 AM

## 2014-06-05 NOTE — Progress Notes (Signed)
Physical Therapy Treatment Patient Details Name: Danielle [name] MRN: [id number] DOB: 1950/08/27 Today's Date: 06/05/2014    History of Present Illness Pt is a 63 y.o. female s/p Rt TKA 06/03/14.    PT Comments    Pt able to progress mobility this session with sitting rest break due to fatigue. Pt very motivated to progress with therapy. Cont to recommend SNF for post acute rehab.   Follow Up Recommendations  SNF     Equipment Recommendations  Other (comment)    Recommendations for Other Services       Precautions / Restrictions Precautions Precautions: Fall;Knee Precaution Comments: reviewed no pillow under knee Restrictions Weight Bearing Restrictions: Yes RLE Weight Bearing: Partial weight bearing RLE Partial Weight Bearing Percentage or Pounds: 50%    Mobility  Bed Mobility               General bed mobility comments: pt up on BSC upon entering room  Transfers Overall transfer level: Needs assistance Equipment used: Rolling walker (2 wheeled) Transfers: Sit to/from Stand Sit to Stand: Mod assist         General transfer comment: cues for hand placement; (A) to balance and power up; pt with heavy lean posteriorly initially  Ambulation/Gait Ambulation/Gait assistance: +2 safety/equipment;Min assist Ambulation Distance (Feet): 20 Feet (10' x 2) Assistive device: Rolling walker (2 wheeled) Gait Pattern/deviations: Decreased stance time - right;Decreased step length - left;Antalgic;Wide base of support;Trunk flexed Gait velocity: decreaseed Gait velocity interpretation: Below normal speed for age/gender General Gait Details: cues for step through gt sequencing and PWB status; 2 person helpful to follow with chair for safety; required standing rest break to progress mobility    Stairs            Wheelchair Mobility    Modified Rankin (Stroke Patients Only)       Balance Overall balance assessment: Needs assistance Sitting-balance support: Feet  supported;No upper extremity supported Sitting balance-Leahy Scale: Fair     Standing balance support: During functional activity;Bilateral upper extremity supported Standing balance-Leahy Scale: Poor Standing balance comment: relies on RW to balance                    Cognition Arousal/Alertness: Awake/alert Behavior During Therapy: WFL for tasks assessed/performed Overall Cognitive Status: Within Functional Limits for tasks assessed                      Exercises Total Joint Exercises Ankle Circles/Pumps: AROM;Both;10 reps;Supine Quad Sets: AROM;Right;10 reps;Seated Heel Slides: AAROM;Right;5 reps;Seated;Limitations Heel Slides Limitations: pain Hip ABduction/ADduction: AAROM;Right;10 reps;Seated Long Arc Quad: AROM;Right;10 reps;Seated Goniometric ROM: -5 to 70 degrees in sitting    General Comments        Pertinent Vitals/Pain Pain Assessment: No/denies pain    Home Living                      Prior Function            PT Goals (current goals can now be found in the care plan section) Acute Rehab PT Goals Patient Stated Goal: to keep walking more PT Goal Formulation: With patient Time For Goal Achievement: 06/11/14 Potential to Achieve Goals: Good Progress towards PT goals: Progressing toward goals    Frequency  7X/week    PT Plan Current plan remains appropriate    Co-evaluation             End of Session Equipment Utilized During Treatment: Gait belt Activity  Tolerance: Patient limited by fatigue Patient left: in chair;with call bell/phone within reach     Time: 0912-0928 PT Time Calculation (min) (ACUTE ONLY): 16 min  Charges:  $Gait Training: 8-22 mins                    G Codes:      Elie Confer [place],  [place] 06/05/2014, 12:40 PM

## 2014-06-05 NOTE — Care Management Note (Signed)
CARE MANAGEMENT NOTE 06/05/2014  Patient:  GRACIELLA, ARMENT   Account Number:  [id number]  Date Initiated:  06/05/2014  Documentation initiated by:  Ricki Miller  Subjective/Objective Assessment:   63 yr old female with osteoarthritis of right knee. Patient had a right total knee arthroplasty.     Action/Plan:   Patient will go for shortterm rehab at [facility]. Will go to [facility] in [place], [place]. Social worker is aware.   Anticipated DC Date:  06/05/2014   Anticipated DC Plan:  SKILLED NURSING FACILITY  In-house referral  Clinical Social Worker      DC Planning Services  CM consult      [facility] Choice  NA   Choice offered to / List presented to:     DME arranged  NA        [facility] arranged  NA      Status of service:  Completed, signed off Medicare Important Message given?   (If response is "NO", the following Medicare IM given date fields will be blank) Date Medicare IM given:   Medicare IM given by:   Date Additional Medicare IM given:   Additional Medicare IM given by:    Discharge Disposition:  [place]  Per UR Regulation:  Reviewed for med. necessity/level of care/duration of stay

## 2014-06-06 NOTE — Clinical Social Work Psychosocial (Signed)
Clinical Social Work Department BRIEF PSYCHOSOCIAL ASSESSMENT 06/06/2014  Patient:  Danielle [name], Danielle [name]     Account Number:  [id number]     Admit date:  06/03/2014  Clinical Social Worker:  Wylene Men  Date/Time:  06/05/2014 08:42 AM  Referred by:  Physician  Date Referred:  06/04/2014 Referred for  Psychosocial assessment  SNF Placement   Other Referral:   none   Interview type:  Patient Other interview type:   none    PSYCHOSOCIAL DATA Living Status:  FAMILY Admitted from facility:   Level of care:   Primary support name:  Patty Primary support relationship to patient:  SIBLING Degree of support available:   strong    CURRENT CONCERNS Current Concerns  Post-Acute Placement   Other Concerns:   none    SOCIAL WORK ASSESSMENT / PLAN CSW assesed patient at beside.  Patient was sitting in bedside chair and alert and oriented x4 during the ocurse of this assessment.  Patient reports being from home with sister prior to hospitalization and ultimate goal is to return home maneuvering independently before Christmas. Patient is focused on recovery and reports being motivated. PT is recommending STR/SNF at time of dc.  Patient has pre-registered with [facility] SNF in [place].  CSW contacted SNF to confirm, patient will be transferred via PTAR at time of dc.   Assessment/plan status:  Psychosocial Support/Ongoing Assessment of Needs Other assessment/ plan:   no FL2/PASARR needed for out of state placement   Information/referral to community resources:   SNF/STR    PATIENT'S/FAMILY'S RESPONSE TO PLAN OF CARE: Patient is agreeable to sending clinicals to [facility] SNF only.       Nonnie Done, [place] [phone]  Psychiatric & Orthopedics (5N 1-16) Clinical Social Worker

## 2014-06-06 NOTE — Clinical Social Work Placement (Signed)
Clinical Social Work Department CLINICAL SOCIAL WORK PLACEMENT NOTE 06/06/2014  Patient:  LASHAWNDRA, LAMPKINS  Account Number:  [id number] Admit date:  06/03/2014  Clinical Social Worker:  Wylene Men  Date/time:  06/04/2014 08:46 AM  Clinical Social Work is seeking post-discharge placement for this patient at the following level of care:   [place]   (*CSW will update this form in Epic as items are completed)   06/04/2014  Patient/family provided with [place] Department of Clinical Social Work's list of facilities offering this level of care within the geographic area requested by the patient (or if unable, by the patient's family).  06/04/2014  Patient/family informed of their freedom to choose among providers that offer the needed level of care, that participate in Medicare, Medicaid or managed care program needed by the patient, have an available bed and are willing to accept the patient.  06/04/2014  Patient/family informed of [facility]' ownership interest in [facility], as well as of the fact that they are under no obligation to receive care at this facility.  PASARR submitted to E[employer] on (not needed for out of state placement) PASARR number received on   FL2 transmitted to all facilities in geographic area requested by pt/family on  (not needed for out of state placement) FL2 transmitted to all facilities within larger geographic area on   Patient informed that his/her managed care company has contracts with or will negotiate with  certain facilities, including the following:     Patient/family informed of bed offers received:   Patient chooses bed at Other: [facility] SNF in [place] (pre-registered) Physician recommends and patient chooses bed at    Patient to be transferred to Other on  06/05/2014 Patient to be transferred to facility by PTAR Patient and family notified of transfer on 06/05/2014 Name of family member notified:  Sister Patty  The  following physician request were entered in Epic:   Additional Comments: patient dc'd to [facility] SNF in [place].  FL2 and PASSAR not needed for out of state placement.   Nonnie Done, [place] [phone]  Psychiatric & Orthopedics (5N 1-16) Clinical Social Worker

## 2014-11-11 ENCOUNTER — Other Ambulatory Visit: Payer: Self-pay | Admitting: Obstetrics and Gynecology

## 2014-11-12 LAB — CYTOLOGY - PAP

## 2016-11-23 ENCOUNTER — Other Ambulatory Visit: Payer: Self-pay | Admitting: Obstetrics and Gynecology

## 2016-11-23 DIAGNOSIS — R928 Other abnormal and inconclusive findings on diagnostic imaging of breast: Secondary | ICD-10-CM

## 2016-11-28 ENCOUNTER — Ambulatory Visit
Admission: RE | Admit: 2016-11-28 | Discharge: 2016-11-28 | Disposition: A | Discharge status: Home / Self Care | Payer: BLUE CROSS/BLUE SHIELD | Source: Ambulatory Visit | Attending: Obstetrics and Gynecology | Admitting: Obstetrics and Gynecology

## 2016-11-28 ENCOUNTER — Other Ambulatory Visit: Payer: Self-pay | Admitting: Obstetrics and Gynecology

## 2016-11-28 DIAGNOSIS — N6320 Unspecified lump in the left breast, unspecified quadrant: Secondary | ICD-10-CM

## 2016-11-28 DIAGNOSIS — R928 Other abnormal and inconclusive findings on diagnostic imaging of breast: Secondary | ICD-10-CM

## 2016-11-30 ENCOUNTER — Ambulatory Visit
Admission: RE | Admit: 2016-11-30 | Discharge: 2016-11-30 | Disposition: A | Discharge status: Home / Self Care | Payer: BLUE CROSS/BLUE SHIELD | Source: Ambulatory Visit | Attending: Obstetrics and Gynecology | Admitting: Obstetrics and Gynecology

## 2016-11-30 ENCOUNTER — Other Ambulatory Visit: Payer: Self-pay | Admitting: Obstetrics and Gynecology

## 2016-11-30 DIAGNOSIS — N6320 Unspecified lump in the left breast, unspecified quadrant: Secondary | ICD-10-CM

## 2016-12-22 ENCOUNTER — Other Ambulatory Visit: Payer: Self-pay | Admitting: General Surgery

## 2016-12-22 DIAGNOSIS — D242 Benign neoplasm of left breast: Secondary | ICD-10-CM

## 2017-01-10 ENCOUNTER — Encounter (HOSPITAL_BASED_OUTPATIENT_CLINIC_OR_DEPARTMENT_OTHER): Payer: Self-pay | Admitting: *Deleted

## 2017-01-11 ENCOUNTER — Encounter (HOSPITAL_BASED_OUTPATIENT_CLINIC_OR_DEPARTMENT_OTHER)
Admission: RE | Admit: 2017-01-11 | Discharge: 2017-01-11 | Disposition: A | Discharge status: Home / Self Care | Payer: Medicare Other | Source: Ambulatory Visit | Attending: General Surgery | Admitting: General Surgery

## 2017-01-11 DIAGNOSIS — I10 Essential (primary) hypertension: Secondary | ICD-10-CM | POA: Diagnosis not present

## 2017-01-11 DIAGNOSIS — J45909 Unspecified asthma, uncomplicated: Secondary | ICD-10-CM | POA: Diagnosis not present

## 2017-01-11 DIAGNOSIS — D242 Benign neoplasm of left breast: Secondary | ICD-10-CM | POA: Diagnosis present

## 2017-01-11 DIAGNOSIS — Z800 Family history of malignant neoplasm of digestive organs: Secondary | ICD-10-CM | POA: Diagnosis not present

## 2017-01-11 DIAGNOSIS — Z6841 Body Mass Index (BMI) 40.0 and over, adult: Secondary | ICD-10-CM | POA: Diagnosis not present

## 2017-01-11 DIAGNOSIS — Z79899 Other long term (current) drug therapy: Secondary | ICD-10-CM | POA: Diagnosis not present

## 2017-01-11 DIAGNOSIS — Z803 Family history of malignant neoplasm of breast: Secondary | ICD-10-CM | POA: Diagnosis not present

## 2017-01-11 LAB — BASIC METABOLIC PANEL
ANION GAP: 6 (ref 5–15)
BUN: 16 mg/dL (ref 6–20)
CHLORIDE: 103 mmol/L (ref 101–111)
CO2: 32 mmol/L (ref 22–32)
Calcium: 9.2 mg/dL (ref 8.9–10.3)
Creatinine, Ser: 0.93 mg/dL (ref 0.44–1.00)
GFR calc non Af Amer: >60 mL/min (ref >60)
Glucose, Bld: 90 mg/dL (ref 65–99)
POTASSIUM: 4 mmol/L (ref 3.5–5.1)
SODIUM: 141 mmol/L (ref 135–145)

## 2017-01-11 NOTE — Progress Notes (Signed)
EKG reviewed by Dr. Foster, will proceed with surgery as scheduled. 

## 2017-01-11 NOTE — Progress Notes (Signed)
Pt in for PAT appointment, Boost breeze given and instructions reviewed. Anesthesia consult not done at this time as Dr. Royce Macadamia was not available due to patient acuity in the OR.

## 2017-01-12 NOTE — H&P (Signed)
Danielle [name] Location: [facility] Patient #: [id number] DOB: Dec 24, 1950 Single / Language: Cleophus Molt / Race: White Female        History of Present Illness       The patient is a 66 year old female who presents with a complaint of intraductal papilloma left breast. This is a 67 year old female from [place], referred by Dr. Linton Ham at the [facility] for evaluation and management of intraductal papilloma left breast, 1 o'clock position. Dr. Gaetano Net here in [place] is her gynecologist. Dr. Eber Hong in [place] is her PCP.      The only prior breast problems she's had his a right breast biopsy over 10 years ago for calcifications which was completely benign. She has no nipple discharge or symptoms. Screening mammography and follow-up mammogram and ultrasound showed category B density. There was a 9 mm oval mass in the left breast at the 1 o'clock position, 1 cm from the nipple. The left axilla was negative by ultrasound. Biopsy shows intraductal papilloma. No atypia was identified. She had some bruising but is basically doing well.     Past history reveals the right breast biopsy in the past. Asthma well-controlled. Hypertension. BMI 41      Family history reveals mother had breast cancer and survived. Mother also survived colon cancer but ultimately died of congestive heart failure. A niece also has breast cancer and is apparently struggling with persistent disease. The patient was somewhat tearful when we discussed the niece.     Social history reveals she is single. No children. Lives in [place]. 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 Denies alcohol or tobacco.      The patient does not want to take any chance. She will be scheduled for left breast lumpectomy with radioactive seed localization. I discussed the indications, details, techniques, and numerous risk of surgery with her. She is aware of the risk of  bleeding, infection, cosmetic deformity, flattening and numbness of the nipple, reoperation of cancer. She understands all these issues. All of her questions were answered. She agrees with this plan. She request cone day surgery  Posting sheet filled out. Orders entered into Epic    Past Surgical History  Breast Biopsy  Bilateral. multiple Breast Mass; Local Excision  Right. Cataract Surgery  Bilateral. Gallbladder Surgery - Laparoscopic  Knee Surgery  Right. Oral Surgery   Diagnostic Studies History  Colonoscopy  never Mammogram  within last year  Allergies  No Known Drug Allergies  Allergies Reconciled   Medication History  Flovent HFA (110MCG/ACT Aerosol, Inhalation) Active. HydroCHLOROthiazide (25MG  Tablet, Oral) Active. Nystatin (100000 UNIT/GM Cream, External) Active. ProAir HFA (108 (90 Base)MCG/ACT Aerosol Soln, Inhalation) Active. ZyrTEC Allergy (10MG  Tablet, Oral) Active. Medications Reconciled  Social History  Caffeine use  Carbonated beverages, Coffee. No alcohol use  No drug use  Tobacco use  Never smoker.  Family History  Alcohol Abuse  Father. Arthritis  Brother, Sister. Breast Cancer  Family Members In General, Mother. Cancer  Brother, Father. Colon Cancer  Mother. Diabetes Mellitus  Family Members In General. Heart Disease  Mother. Hypertension  Mother. Respiratory Condition  Father. Thyroid problems  Brother.  Pregnancy / Birth History  Age at menarche  2 years. Age of menopause  62-61 Contraceptive History  Oral contraceptives. Gravida  0 Para  0  Other Problems  Arthritis  Asthma  High blood pressure  Lump In Breast     Review of Systems  General Not Present- Appetite Loss,  Chills, Fatigue, Fever, Night Sweats, Weight Gain and Weight Loss. Skin Not Present- Change in Wart/Mole, Dryness, Hives, Jaundice, New Lesions, Non-Healing Wounds, Rash and Ulcer. HEENT Present- Seasonal Allergies and  Wears glasses/contact lenses. Not Present- Earache, Hearing Loss, Hoarseness, Nose Bleed, Oral Ulcers, Ringing in the Ears, Sinus Pain, Sore Throat, Visual Disturbances and Yellow Eyes. Respiratory Present- Wheezing. Not Present- Bloody sputum, Chronic Cough, Difficulty Breathing and Snoring. Breast Present- Breast Mass. Not Present- Breast Pain, Nipple Discharge and Skin Changes. Cardiovascular Not Present- Chest Pain, Difficulty Breathing Lying Down, Leg Cramps, Palpitations, Rapid Heart Rate, Shortness of Breath and Swelling of Extremities. Gastrointestinal Not Present- Abdominal Pain, Bloating, Bloody Stool, Change in Bowel Habits, Chronic diarrhea, Constipation, Difficulty Swallowing, Excessive gas, Gets full quickly at meals, Hemorrhoids, Indigestion, Nausea, Rectal Pain and Vomiting. Female Genitourinary Not Present- Frequency, Nocturia, Painful Urination, Pelvic Pain and Urgency. Musculoskeletal Present- Joint Pain and Joint Stiffness. Not Present- Back Pain, Muscle Pain, Muscle Weakness and Swelling of Extremities. Neurological Not Present- Decreased Memory, Fainting, Headaches, Numbness, Seizures, Tingling, Tremor, Trouble walking and Weakness. Psychiatric Not Present- Anxiety, Bipolar, Change in Sleep Pattern, Depression, Fearful and Frequent crying. Endocrine Not Present- Cold Intolerance, Excessive Hunger, Hair Changes, Heat Intolerance, Hot flashes and New Diabetes. Hematology Not Present- Blood Thinners, Easy Bruising, Excessive bleeding, Gland problems, HIV and Persistent Infections.  Vitals  Weight: 257.6 lb Height: 62in Body Surface Area: 2.13 m Body Mass Index: 47.12 kg/m  Temp.: 98.55F  Pulse: 85 (Regular)  P.OX: 96% (Room air) BP: 128/88 (Sitting, Left Arm, Standard)    Physical Exam  General Mental Status-Alert. General Appearance-Consistent with stated age. Hydration-Well hydrated. Voice-Normal. Note: BMI 47   Head and  Neck Head-normocephalic, atraumatic with no lesions or palpable masses. Trachea-midline. Thyroid Gland Characteristics - normal size and consistency.  Eye Eyeball - Bilateral-Extraocular movements intact. Sclera/Conjunctiva - Bilateral-No scleral icterus. Note: Right cornea clouded   Chest and Lung Exam Chest and lung exam reveals -quiet, even and easy respiratory effort with no use of accessory muscles and on auscultation, normal breath sounds, no adventitious sounds and normal vocal resonance. Inspection Chest Wall - Normal. Back - normal.  Breast Note: Breasts are moderately large but pendulous and a little bit atrophic. Some resolving ecchymoses left breast periareolar area. Maybe slight thickening at the 1 o'clock position but not a discrete mass. I cannot see any other scars. No other mass in either breast. No axillary adenopathy.   Cardiovascular Cardiovascular examination reveals -normal heart sounds, regular rate and rhythm with no murmurs and normal pedal pulses bilaterally.  Abdomen Inspection Inspection of the abdomen reveals - No Hernias. Skin - Scar - no surgical scars. Palpation/Percussion Palpation and Percussion of the abdomen reveal - Soft, Non Tender, No Rebound tenderness, No Rigidity (guarding) and No hepatosplenomegaly. Auscultation Auscultation of the abdomen reveals - Bowel sounds normal.  Neurologic Neurologic evaluation reveals -alert and oriented x 3 with no impairment of recent or remote memory. Mental Status-Normal.  Musculoskeletal Normal Exam - Left-Upper Extremity Strength Normal and Lower Extremity Strength Normal. Normal Exam - Right-Upper Extremity Strength Normal and Lower Extremity Strength Normal.  Lymphatic Head & Neck  General Head & Neck Lymphatics: Bilateral - Description - Normal. Axillary  General Axillary Region: Bilateral - Description - Normal. Tenderness - Non Tender. Femoral & Inguinal  Generalized  Femoral & Inguinal Lymphatics: Bilateral - Description - Normal. Tenderness - Non Tender.    Assessment & Plan  PAPILLOMA OF LEFT BREAST (D24.2).  Your recent imaging studies and biopsies  show a 9 mm intraductal papilloma of the left breast at the 1 o'clock position, relatively central and close to the nipple and areola There was no atypia mentioned on the pathology At most, there is a 5-10% chance that this contains atypical cells or in situ cancer at this time. Most likely you do not have cancer  Because of your concern and your family history of breast cancer, we have agreed to go ahead and excise this surgically for confirmation You'll be scheduled for left breast lumpectomy with radioactive seed localization in the near future Because you live in [place], we will ask to see if we can have the radioactive seed placed the same day as the surgical biopsy. We have discussed the indications, techniques, and risk of the surgery in detail  HYPERTENSION, BENIGN (I10) ASTHMA IN ADULT (J45.909) BMI 45.0-49.9, ADULT (Z68.42) FAMILY HISTORY OF BREAST CANCER (Z80.3) Impression: Mother had breast cancer, survived. Niece has breast cancer, is struggling with her disease    Edsel Petrin. Dalbert Batman, M.D., [facility] General and Minimally invasive Surgery Breast and Colorectal Surgery Office:   [phone] Pager:   [phone]

## 2017-01-16 ENCOUNTER — Ambulatory Visit
Admission: RE | Admit: 2017-01-16 | Discharge: 2017-01-16 | Disposition: A | Discharge status: Home / Self Care | Payer: Medicare Other | Source: Ambulatory Visit | Attending: General Surgery | Admitting: General Surgery

## 2017-01-16 DIAGNOSIS — D242 Benign neoplasm of left breast: Secondary | ICD-10-CM

## 2017-01-17 ENCOUNTER — Encounter (HOSPITAL_BASED_OUTPATIENT_CLINIC_OR_DEPARTMENT_OTHER)
Admission: RE | Disposition: A | Discharge status: Home / Self Care | Payer: Self-pay | Source: Ambulatory Visit | Attending: General Surgery

## 2017-01-17 ENCOUNTER — Ambulatory Visit
Admission: RE | Admit: 2017-01-17 | Discharge: 2017-01-17 | Disposition: A | Discharge status: Home / Self Care | Payer: Medicare Other | Source: Ambulatory Visit | Attending: General Surgery | Admitting: General Surgery

## 2017-01-17 ENCOUNTER — Encounter (HOSPITAL_BASED_OUTPATIENT_CLINIC_OR_DEPARTMENT_OTHER): Payer: Self-pay | Admitting: *Deleted

## 2017-01-17 ENCOUNTER — Ambulatory Visit (HOSPITAL_BASED_OUTPATIENT_CLINIC_OR_DEPARTMENT_OTHER)
Admission: RE | Admit: 2017-01-17 | Discharge: 2017-01-17 | Disposition: A | Discharge status: Home / Self Care | Payer: Medicare Other | Source: Ambulatory Visit | Attending: General Surgery | Admitting: General Surgery

## 2017-01-17 ENCOUNTER — Ambulatory Visit (HOSPITAL_BASED_OUTPATIENT_CLINIC_OR_DEPARTMENT_OTHER): Payer: Medicare Other | Admitting: Anesthesiology

## 2017-01-17 DIAGNOSIS — Z6841 Body Mass Index (BMI) 40.0 and over, adult: Secondary | ICD-10-CM | POA: Insufficient documentation

## 2017-01-17 DIAGNOSIS — Z800 Family history of malignant neoplasm of digestive organs: Secondary | ICD-10-CM | POA: Insufficient documentation

## 2017-01-17 DIAGNOSIS — I10 Essential (primary) hypertension: Secondary | ICD-10-CM | POA: Diagnosis not present

## 2017-01-17 DIAGNOSIS — Z79899 Other long term (current) drug therapy: Secondary | ICD-10-CM | POA: Insufficient documentation

## 2017-01-17 DIAGNOSIS — D242 Benign neoplasm of left breast: Secondary | ICD-10-CM | POA: Diagnosis not present

## 2017-01-17 DIAGNOSIS — Z803 Family history of malignant neoplasm of breast: Secondary | ICD-10-CM | POA: Insufficient documentation

## 2017-01-17 DIAGNOSIS — J45909 Unspecified asthma, uncomplicated: Secondary | ICD-10-CM | POA: Insufficient documentation

## 2017-01-17 HISTORY — DX: Obesity, unspecified: E66.9

## 2017-01-17 HISTORY — PX: BREAST LUMPECTOMY WITH RADIOACTIVE SEED LOCALIZATION: SHX6424

## 2017-01-17 HISTORY — DX: Benign neoplasm of left breast: D24.2

## 2017-01-17 HISTORY — DX: Other specified symptoms and signs involving the circulatory and respiratory systems: R09.89

## 2017-01-17 SURGERY — BREAST LUMPECTOMY WITH RADIOACTIVE SEED LOCALIZATION
Anesthesia: General | Site: Breast | Laterality: Left

## 2017-01-17 MED ORDER — PROPOFOL 10 MG/ML IV BOLUS
INTRAVENOUS | Status: DC | PRN
  Administered 2017-01-17: 150 mg via INTRAVENOUS

## 2017-01-17 MED ORDER — CELECOXIB 200 MG PO CAPS
ORAL_CAPSULE | ORAL | Status: AC
  Filled 2017-01-17: qty 2

## 2017-01-17 MED ORDER — MIDAZOLAM HCL 2 MG/2ML IJ SOLN: 1.0000 mg | INTRAMUSCULAR | Status: DC | PRN

## 2017-01-17 MED ORDER — LACTATED RINGERS IV SOLN
INTRAVENOUS | Status: DC
  Administered 2017-01-17 (×2): via INTRAVENOUS

## 2017-01-17 MED ORDER — CEFAZOLIN SODIUM-DEXTROSE 2-4 GM/100ML-% IV SOLN
2.0000 g | INTRAVENOUS | Status: AC
  Administered 2017-01-17: 2 g via INTRAVENOUS

## 2017-01-17 MED ORDER — LIDOCAINE HCL (CARDIAC) 20 MG/ML IV SOLN
INTRAVENOUS | Status: DC | PRN
  Administered 2017-01-17: 60 mg via INTRAVENOUS

## 2017-01-17 MED ORDER — MIDAZOLAM HCL 2 MG/2ML IJ SOLN
INTRAMUSCULAR | Status: AC
  Filled 2017-01-17: qty 2

## 2017-01-17 MED ORDER — MIDAZOLAM HCL 5 MG/5ML IJ SOLN
INTRAMUSCULAR | Status: DC | PRN
  Administered 2017-01-17 (×2): 1 mg via INTRAVENOUS

## 2017-01-17 MED ORDER — BUPIVACAINE-EPINEPHRINE (PF) 0.5% -1:200000 IJ SOLN
INTRAMUSCULAR | Status: DC | PRN
  Administered 2017-01-17: 10 mL

## 2017-01-17 MED ORDER — FENTANYL CITRATE (PF) 100 MCG/2ML IJ SOLN: 50.0000 ug | INTRAMUSCULAR | Status: DC | PRN

## 2017-01-17 MED ORDER — CELECOXIB 400 MG PO CAPS
400.0000 mg | ORAL_CAPSULE | ORAL | Status: AC
  Administered 2017-01-17: 400 mg via ORAL

## 2017-01-17 MED ORDER — LIDOCAINE 2% (20 MG/ML) 5 ML SYRINGE
INTRAMUSCULAR | Status: AC
  Filled 2017-01-17: qty 5

## 2017-01-17 MED ORDER — DEXAMETHASONE SODIUM PHOSPHATE 4 MG/ML IJ SOLN
INTRAMUSCULAR | Status: DC | PRN
  Administered 2017-01-17: 10 mg via INTRAVENOUS

## 2017-01-17 MED ORDER — FENTANYL CITRATE (PF) 100 MCG/2ML IJ SOLN
INTRAMUSCULAR | Status: DC | PRN
  Administered 2017-01-17 (×4): 25 ug via INTRAVENOUS

## 2017-01-17 MED ORDER — CHLORHEXIDINE GLUCONATE CLOTH 2 % EX PADS: 6.0000 | MEDICATED_PAD | Freq: Once | CUTANEOUS | Status: DC

## 2017-01-17 MED ORDER — GABAPENTIN 300 MG PO CAPS
300.0000 mg | ORAL_CAPSULE | ORAL | Status: AC
  Administered 2017-01-17: 300 mg via ORAL

## 2017-01-17 MED ORDER — CEFAZOLIN SODIUM-DEXTROSE 2-4 GM/100ML-% IV SOLN
INTRAVENOUS | Status: AC
  Filled 2017-01-17: qty 100

## 2017-01-17 MED ORDER — ACETAMINOPHEN 500 MG PO TABS
ORAL_TABLET | ORAL | Status: AC
  Filled 2017-01-17: qty 2

## 2017-01-17 MED ORDER — DEXAMETHASONE SODIUM PHOSPHATE 10 MG/ML IJ SOLN
INTRAMUSCULAR | Status: AC
  Filled 2017-01-17: qty 1

## 2017-01-17 MED ORDER — ONDANSETRON HCL 4 MG/2ML IJ SOLN
INTRAMUSCULAR | Status: DC | PRN
  Administered 2017-01-17: 4 mg via INTRAVENOUS

## 2017-01-17 MED ORDER — FENTANYL CITRATE (PF) 100 MCG/2ML IJ SOLN
INTRAMUSCULAR | Status: AC
  Filled 2017-01-17: qty 2

## 2017-01-17 MED ORDER — HYDROCODONE-ACETAMINOPHEN 5-325 MG PO TABS: 1.0000 | ORAL_TABLET | Freq: Four times a day (QID) | ORAL | 0 refills | Status: AC | PRN

## 2017-01-17 MED ORDER — ACETAMINOPHEN 500 MG PO TABS
1000.0000 mg | ORAL_TABLET | ORAL | Status: AC
  Administered 2017-01-17: 1000 mg via ORAL

## 2017-01-17 MED ORDER — GABAPENTIN 300 MG PO CAPS
ORAL_CAPSULE | ORAL | Status: AC
  Filled 2017-01-17: qty 1

## 2017-01-17 MED ORDER — SCOPOLAMINE 1 MG/3DAYS TD PT72: 1.0000 | MEDICATED_PATCH | Freq: Once | TRANSDERMAL | Status: DC | PRN

## 2017-01-17 MED ORDER — ONDANSETRON HCL 4 MG/2ML IJ SOLN
INTRAMUSCULAR | Status: AC
  Filled 2017-01-17: qty 2

## 2017-01-17 MED ORDER — HYDROMORPHONE HCL 1 MG/ML IJ SOLN: 0.2500 mg | INTRAMUSCULAR | Status: DC | PRN

## 2017-01-17 MED ORDER — PROPOFOL 10 MG/ML IV BOLUS
INTRAVENOUS | Status: AC
  Filled 2017-01-17: qty 40

## 2017-01-17 SURGICAL SUPPLY — 43 items
ADH SKN CLS APL DERMABOND .7 (GAUZE/BANDAGES/DRESSINGS) ×1
APPLIER CLIP 9.375 MED OPEN (MISCELLANEOUS) ×2
APR CLP MED 9.3 20 MLT OPN (MISCELLANEOUS) ×1
BINDER BREAST XXLRG (GAUZE/BANDAGES/DRESSINGS) ×2 IMPLANT
BLADE HEX COATED 2.75 (ELECTRODE) ×2 IMPLANT
BLADE SURG 15 STRL LF DISP TIS (BLADE) ×1 IMPLANT
BLADE SURG 15 STRL SS (BLADE) ×2
CANISTER SUCT 1200ML W/VALVE (MISCELLANEOUS) ×2 IMPLANT
CHLORAPREP W/TINT 26ML (MISCELLANEOUS) ×2 IMPLANT
CLIP APPLIE 9.375 MED OPEN (MISCELLANEOUS) ×1 IMPLANT
COVER BACK TABLE 60X90IN (DRAPES) ×2 IMPLANT
COVER MAYO STAND STRL (DRAPES) ×2 IMPLANT
COVER PROBE W GEL 5X96 (DRAPES) ×2 IMPLANT
DERMABOND ADVANCED (GAUZE/BANDAGES/DRESSINGS) ×1
DERMABOND ADVANCED .7 DNX12 (GAUZE/BANDAGES/DRESSINGS) ×1 IMPLANT
DEVICE DUBIN W/COMP PLATE 8390 (MISCELLANEOUS) ×2 IMPLANT
DRAPE LAPAROSCOPIC ABDOMINAL (DRAPES) ×2 IMPLANT
DRAPE UTILITY XL STRL (DRAPES) ×2 IMPLANT
DRSG PAD ABDOMINAL 8X10 ST (GAUZE/BANDAGES/DRESSINGS) ×1 IMPLANT
ELECT REM PT RETURN 9FT ADLT (ELECTROSURGICAL) ×2
ELECTRODE REM PT RTRN 9FT ADLT (ELECTROSURGICAL) ×1 IMPLANT
GAUZE SPONGE 4X4 12PLY STRL LF (GAUZE/BANDAGES/DRESSINGS) ×1 IMPLANT
GLOVE EUDERMIC 7 POWDERFREE (GLOVE) ×2 IMPLANT
GOWN STRL REUS W/ TWL LRG LVL3 (GOWN DISPOSABLE) ×1 IMPLANT
GOWN STRL REUS W/ TWL XL LVL3 (GOWN DISPOSABLE) ×1 IMPLANT
GOWN STRL REUS W/TWL LRG LVL3 (GOWN DISPOSABLE) ×2
GOWN STRL REUS W/TWL XL LVL3 (GOWN DISPOSABLE) ×2
KIT MARKER MARGIN INK (KITS) ×2 IMPLANT
NDL HYPO 25X1 1.5 SAFETY (NEEDLE) ×1 IMPLANT
NEEDLE HYPO 25X1 1.5 SAFETY (NEEDLE) ×2 IMPLANT
NS IRRIG 1000ML POUR BTL (IV SOLUTION) ×2 IMPLANT
PACK BASIN DAY SURGERY FS (CUSTOM PROCEDURE TRAY) ×2 IMPLANT
PENCIL BUTTON HOLSTER BLD 10FT (ELECTRODE) ×2 IMPLANT
SLEEVE SCD COMPRESS KNEE MED (MISCELLANEOUS) ×2 IMPLANT
SPONGE LAP 4X18 X RAY DECT (DISPOSABLE) ×2 IMPLANT
SUT MNCRL AB 4-0 PS2 18 (SUTURE) ×2 IMPLANT
SUT SILK 2 0 SH (SUTURE) ×2 IMPLANT
SUT VICRYL 3-0 CR8 SH (SUTURE) ×2 IMPLANT
SYR 10ML LL (SYRINGE) ×2 IMPLANT
TOWEL OR 17X24 6PK STRL BLUE (TOWEL DISPOSABLE) ×2 IMPLANT
TOWEL OR NON WOVEN STRL DISP B (DISPOSABLE) ×1 IMPLANT
TUBE CONNECTING 20X1/4 (TUBING) ×2 IMPLANT
YANKAUER SUCT BULB TIP NO VENT (SUCTIONS) ×2 IMPLANT

## 2017-01-17 NOTE — Anesthesia Preprocedure Evaluation (Addendum)
Anesthesia Evaluation  Patient identified by MRN, date of birth, ID band Patient awake    Reviewed: Allergy & Precautions, H&P , NPO status , Patient's Chart, lab work & pertinent test results  Airway Mallampati: III  TM Distance: >3 FB Neck ROM: Full    Dental no notable dental hx. (+) Teeth Intact, Dental Advisory Given   Pulmonary asthma ,    Pulmonary exam normal breath sounds clear to auscultation       Cardiovascular hypertension, Pt. on medications  Rhythm:Regular Rate:Normal     Neuro/Psych negative neurological ROS  negative psych ROS   GI/Hepatic Neg liver ROS, GERD  Medicated and Controlled,  Endo/Other  Morbid obesity  Renal/GU negative Renal ROS  negative genitourinary   Musculoskeletal  (+) Arthritis , Osteoarthritis,    Abdominal   Peds  Hematology negative hematology ROS (+)   Anesthesia Other Findings   Reproductive/Obstetrics negative OB ROS                            Anesthesia Physical Anesthesia Plan  ASA: III  Anesthesia Plan: General   Post-op Pain Management:    Induction: Intravenous  PONV Risk Score and Plan: 4 or greater and Ondansetron, Dexamethasone and Midazolam  Airway Management Planned: LMA  Additional Equipment:   Intra-op Plan:   Post-operative Plan: Extubation in OR  Informed Consent: I have reviewed the patients History and Physical, chart, labs and discussed the procedure including the risks, benefits and alternatives for the proposed anesthesia with the patient or authorized representative who has indicated his/her understanding and acceptance.   Dental advisory given  Plan Discussed with: CRNA  Anesthesia Plan Comments:        Anesthesia Quick Evaluation

## 2017-01-17 NOTE — Anesthesia Procedure Notes (Signed)
Procedure Name: LMA Insertion Date/Time: 01/17/2017 12:21 PM Performed by: Justice Rocher Pre-anesthesia Checklist: Patient identified, Emergency Drugs available, Suction available and Patient being monitored Patient Re-evaluated:Patient Re-evaluated prior to induction Oxygen Delivery Method: Circle system utilized Preoxygenation: Pre-oxygenation with 100% oxygen Induction Type: IV induction Ventilation: Mask ventilation without difficulty LMA: LMA inserted LMA Size: 4.0 Number of attempts: 1 Airway Equipment and Method: Bite block Placement Confirmation: positive ETCO2 and breath sounds checked- equal and bilateral Tube secured with: Tape Dental Injury: Teeth and Oropharynx as per pre-operative assessment

## 2017-01-17 NOTE — Discharge Instructions (Signed)
[facility] °Office Phone Number [phone] ° °BREAST BIOPSY/ PARTIAL MASTECTOMY: POST OP INSTRUCTIONS ° °Always review your discharge instruction sheet given to you by the facility where your surgery was performed. ° °IF YOU HAVE DISABILITY OR FAMILY LEAVE FORMS, YOU MUST BRING THEM TO THE OFFICE FOR PROCESSING.  DO NOT GIVE THEM TO YOUR DOCTOR. ° °1. A prescription for pain medication may be given to you upon discharge.  Take your pain medication as prescribed, if needed.  If narcotic pain medicine is not needed, then you may take acetaminophen (Tylenol) or ibuprofen (Advil) as needed. °2. Take your usually prescribed medications unless otherwise directed °3. If you need a refill on your pain medication, please contact your pharmacy.  They will contact our office to request authorization.  Prescriptions will not be filled after 5pm or on week-ends. °4. You should eat very light the first 24 hours after surgery, such as soup, crackers, pudding, etc.  Resume your normal diet the day after surgery. °5. Most patients will experience some swelling and bruising in the breast.  Ice packs and a good support bra will help.  Swelling and bruising can take several days to resolve.  °6. It is common to experience some constipation if taking pain medication after surgery.  Increasing fluid intake and taking a stool softener will usually help or prevent this problem from occurring.  A mild laxative (Milk of Magnesia or Miralax) should be taken according to package directions if there are no bowel movements after 48 hours. °7. Unless discharge instructions indicate otherwise, you may remove your bandages 24-48 hours after surgery, and you may shower at that time.  You may have steri-strips (small skin tapes) in place directly over the incision.  These strips should be left on the skin for 7-10 days.  If your surgeon used skin glue on the incision, you may shower in 24 hours.  The glue will flake off over the  next 2-3 weeks.  Any sutures or staples will be removed at the office during your follow-up visit. °8. ACTIVITIES:  You may resume regular daily activities (gradually increasing) beginning the next day.  Wearing a good support bra or sports bra minimizes pain and swelling.  You may have sexual intercourse when it is comfortable. °a. You may drive when you no longer are taking prescription pain medication, you can comfortably wear a seatbelt, and you can safely maneuver your car and apply brakes. °b. RETURN TO WORK:  ______________________________________________________________________________________ °9. You should see your doctor in the office for a follow-up appointment approximately two weeks after your surgery.  Your doctor’s nurse will typically make your follow-up appointment when she calls you with your pathology report.  Expect your pathology report 2-3 business days after your surgery.  You may call to check if you do not hear from us after three days. °10. OTHER INSTRUCTIONS: _______________________________________________________________________________________________ _____________________________________________________________________________________________________________________________________ °_____________________________________________________________________________________________________________________________________ °_____________________________________________________________________________________________________________________________________ ° °WHEN TO CALL YOUR DOCTOR: °1. Fever over 101.0 °2. Nausea and/or vomiting. °3. Extreme swelling or bruising. °4. Continued bleeding from incision. °5. Increased pain, redness, or drainage from the incision. ° °The clinic staff is available to answer your questions during regular business hours.  Please don’t hesitate to call and ask to speak to one of the nurses for clinical concerns.  If you have a medical emergency, go to the nearest  emergency room or call 911.  A surgeon from [facility] is always on call at the hospital. ° °For further questions, please visit [email or web address]  ° ° ° ° °  Post Anesthesia Home Care Instructions ° °Activity: °Get plenty of rest for the remainder of the day. A responsible individual must stay with you for 24 hours following the procedure.  °For the next 24 hours, DO NOT: °-Drive a car °-Operate machinery °-Drink alcoholic beverages °-Take any medication unless instructed by your physician °-Make any legal decisions or sign important papers. ° °Meals: °Start with liquid foods such as gelatin or soup. Progress to regular foods as tolerated. Avoid greasy, spicy, heavy foods. If nausea and/or vomiting occur, drink only clear liquids until the nausea and/or vomiting subsides. Call your physician if vomiting continues. ° °Special Instructions/Symptoms: °Your throat may feel dry or sore from the anesthesia or the breathing tube placed in your throat during surgery. If this causes discomfort, gargle with warm salt water. The discomfort should disappear within 24 hours. ° °If you had a scopolamine patch placed behind your ear for the management of post- operative nausea and/or vomiting: ° °1. The medication in the patch is effective for 72 hours, after which it should be removed.  Wrap patch in a tissue and discard in the trash. Wash hands thoroughly with soap and water. °2. You may remove the patch earlier than 72 hours if you experience unpleasant side effects which may include dry mouth, dizziness or visual disturbances. °3. Avoid touching the patch. Wash your hands with soap and water after contact with the patch. °  ° °

## 2017-01-17 NOTE — Op Note (Signed)
Patient Name:           Danielle [name]   Date of Surgery:        01/17/2017  Pre op Diagnosis:      Intraductal papilloma left breast  Post op Diagnosis:    Same  Procedure:                 Left breast lumpectomy with radioactive seed localization and margin assessment  Surgeon:                     Danielle [name], M.D., FACS  Assistant:                      OR staff   Indication for Assistant: N/A  Operative Indications:   This is a 66 year old female from [place], referred by Dr. Linton Ham at the [facility] for evaluation and management of intraductal papilloma left breast, 1 o'clock position. Dr. Gaetano Net here in [place] is her gynecologist. Dr. Eber Hong in [place] is her PCP.      The only prior breast problems she's had his a right breast biopsy over 10 years ago for calcifications which was completely benign. She has [name] nipple discharge or symptoms. Screening mammography and follow-up mammogram and ultrasound showed category B density. There was a 9 mm oval mass in the left breast at the 1 o'clock position, 1 cm from the nipple. The left axilla was negative by ultrasound. Biopsy shows intraductal papilloma. [name] atypia was identified.      Family history reveals mother had breast cancer and survived. Mother also survived colon cancer but ultimately died of congestive heart failure. A niece also has breast cancer and is apparently struggling with persistent disease.       The patient does not want to take any chance. She will be scheduled for left breast lumpectomy with radioactive seed localization.   Operative Findings:       The biopsy clip and radioactive seed were just posterior to the areolar margin of the left breast at the 2:00 position.  Specimen mammogram showed that the seed marker clip were in the center of the specimen suggesting adequate resection.  There was [name] grossly abnormal palpable finding.   Procedure in Detail:          Following the  induction of general LMA anesthesia the patient's left breast was prepped and draped in a sterile fashion.  Intravenous antibiotics were given.  Surgical timeout was performed.  0.5% Marcaine with epinephrine was used as local infiltration anesthetic.  Using a neoprobe identified the maximum radioactivity at the areolar margin the 2:00 position on the left.  A circumareolar incision was made at the areolar margin to create a hidden scar.  Lumpectomy was performed using the neoprobe and electrocautery.  The specimen was removed and marked with silk sutures and a 6 color ink kit to orient the pathologist.  The specimen mammogram looked very good as described above.  The specimen was marked and sent to the pathology lab.  Hemostasis was excellent.  The wound was irrigated.  Lumpectomy cavity was marked with 5 metal clips to mark the margins of resection.  The breast tissues were closed in layers with interrupted sutures of 3-0 Vicryl and the skin closed with a running subcuticular 4-0 Monocryl and Dermabond.  Dry bandages and a breast binder were placed.  The patient tolerated the procedure well was taken to PACU in stable condition.  EBL 10 mL or less.  Counts correct.  Complications none.     Danielle [name], M.D., FACS General and Minimally Invasive Surgery Breast and Colorectal Surgery    Addendum: I logged onto the [facility] website and reviewed her prescription medication history  01/17/2017 1:08 PM

## 2017-01-17 NOTE — Transfer of Care (Signed)
Immediate Anesthesia Transfer of Care Note  Patient: Danielle [name]  Procedure(s) Performed: Procedure(s) (LRB): LEFT BREAST LUMPECTOMY WITH RADIOACTIVE SEED LOCALIZATION (Left)  Patient Location: PACU  Anesthesia Type: General  Level of Consciousness: awake, sedated, patient cooperative and responds to stimulation  Airway & Oxygen Therapy: Patient Spontanous Breathing and Patient connected to face mask oxygen  Post-op Assessment: Report given to PACU RN, Post -op Vital signs reviewed and stable and Patient moving all extremities  Post vital signs: Reviewed and stable  Complications: No apparent anesthesia complications

## 2017-01-17 NOTE — Interval H&P Note (Signed)
History and Physical Interval Note:  01/17/2017 11:51 AM  Danielle [name]  has presented today for surgery, with the diagnosis of papilloma left breast  The various methods of treatment have been discussed with the patient and family. After consideration of risks, benefits and other options for treatment, the patient has consented to  Procedure(s): LEFT BREAST LUMPECTOMY WITH RADIOACTIVE SEED LOCALIZATION (Left) as a surgical intervention .  The patient's history has been reviewed, patient examined, no change in status, stable for surgery.  I have reviewed the patient's chart and labs.  Questions were answered to the patient's satisfaction.     Adin Hector

## 2017-01-18 ENCOUNTER — Encounter (HOSPITAL_BASED_OUTPATIENT_CLINIC_OR_DEPARTMENT_OTHER): Payer: Self-pay | Admitting: General Surgery

## 2017-01-18 NOTE — Anesthesia Postprocedure Evaluation (Signed)
Anesthesia Post Note  Patient: Renea Schoonmaker  Procedure(s) Performed: Procedure(s) (LRB): LEFT BREAST LUMPECTOMY WITH RADIOACTIVE SEED LOCALIZATION (Left)     Patient location during evaluation: PACU Anesthesia Type: General Level of consciousness: awake and alert Pain management: pain level controlled Vital Signs Assessment: post-procedure vital signs reviewed and stable Respiratory status: spontaneous breathing, nonlabored ventilation and respiratory function stable Cardiovascular status: blood pressure returned to baseline and stable Postop Assessment: no signs of nausea or vomiting Anesthetic complications: no    Last Vitals:  Vitals:   01/17/17 1353 01/17/17 1421  BP:  (!) 146/81  Pulse: 74 77  Resp: (!) 23 20  Temp:  36.6 C    Last Pain:  Vitals:   01/18/17 1015  TempSrc:   PainSc: 0-No pain                 Raenette Sakata,W. EDMOND

## 2019-06-08 IMAGING — MG 2D DIGITAL DIAGNOSTIC UNILATERAL LEFT MAMMOGRAM WITH CAD AND ADJ
6 series · 6 of 14 positions shown · non-contrast
Comparison: Previous exam(s).

CLINICAL DATA: Screening recall for a possible left breast mass.

EXAM:
2D DIGITAL DIAGNOSTIC UNILATERAL LEFT MAMMOGRAM WITH CAD AND ADJUNCT
TOMO
LEFT BREAST ULTRASOUND

[L CC synth-2D]
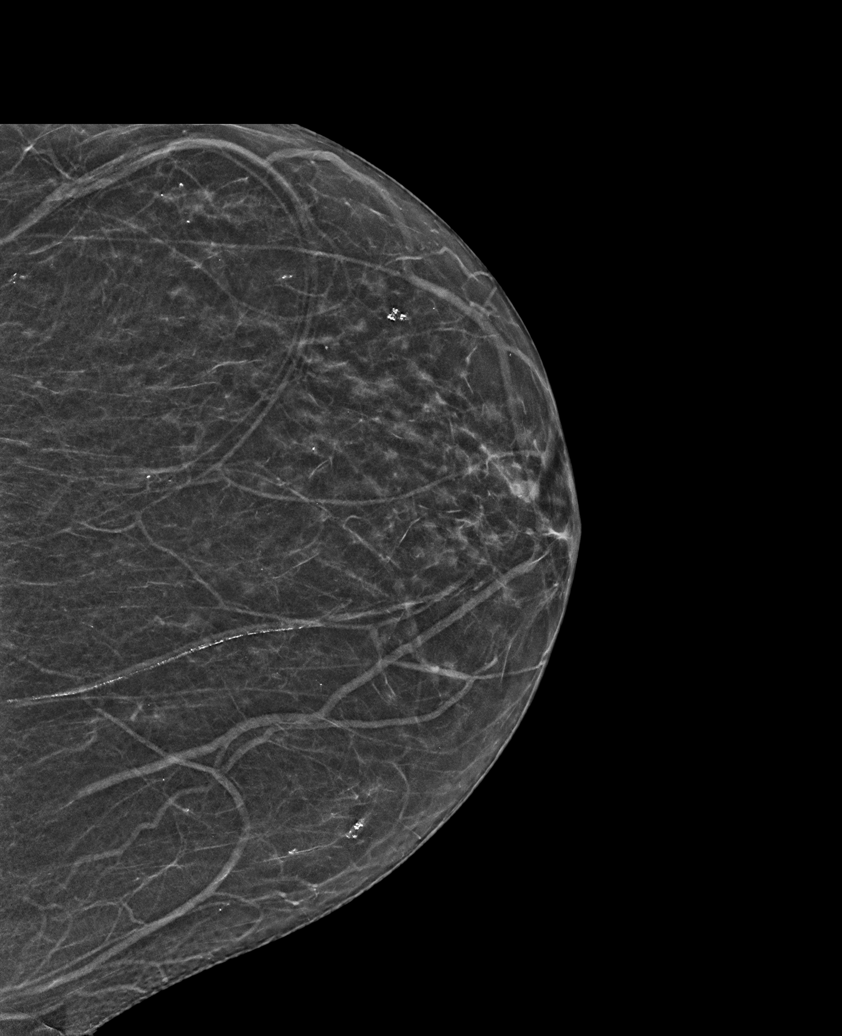

[L MLO synth-2D]
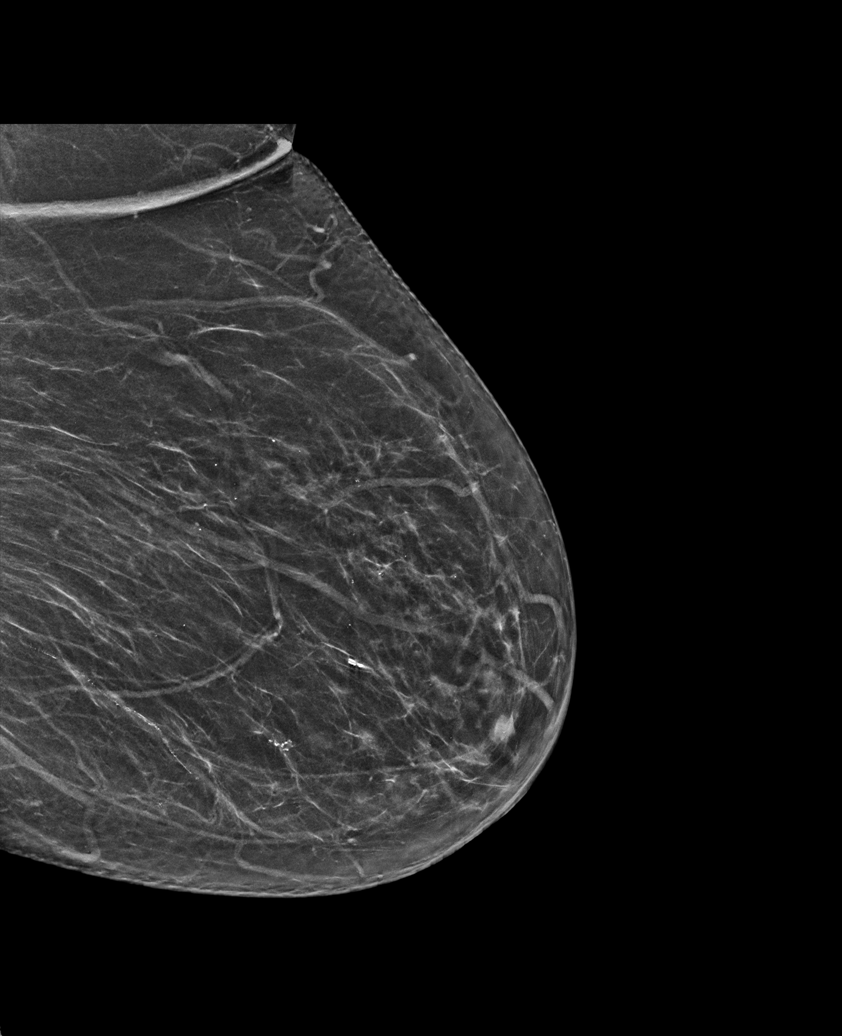

[L MLO]
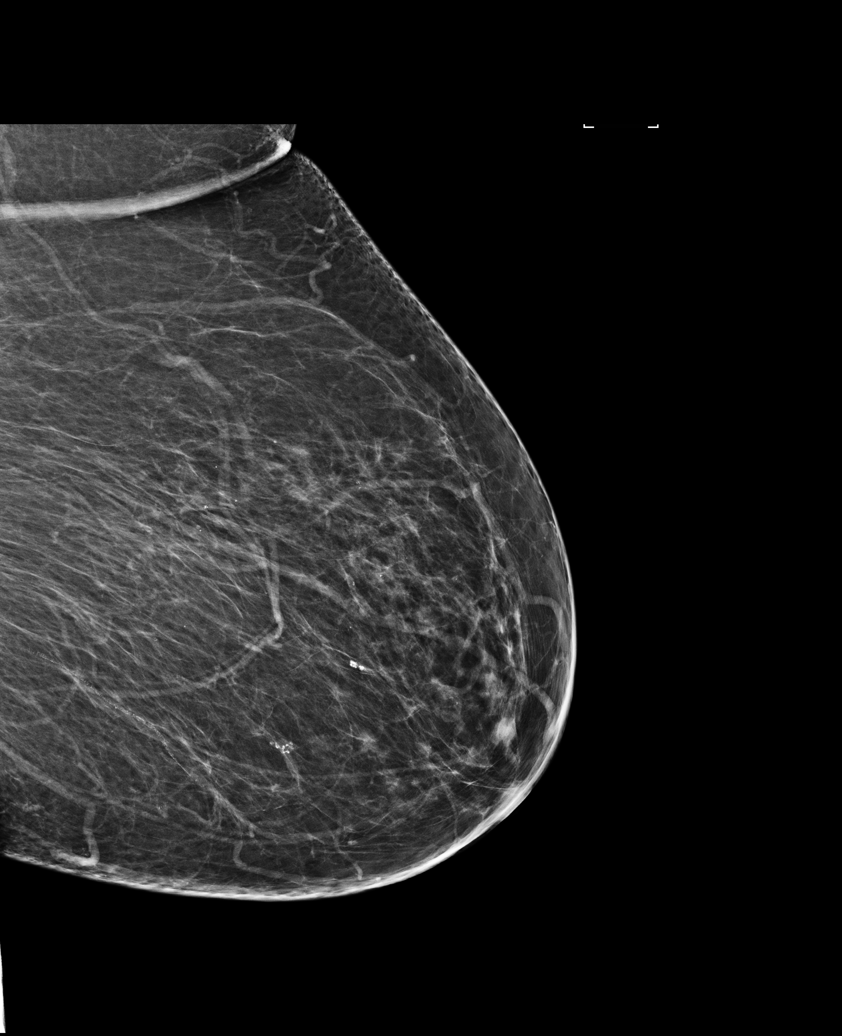

[L CC]
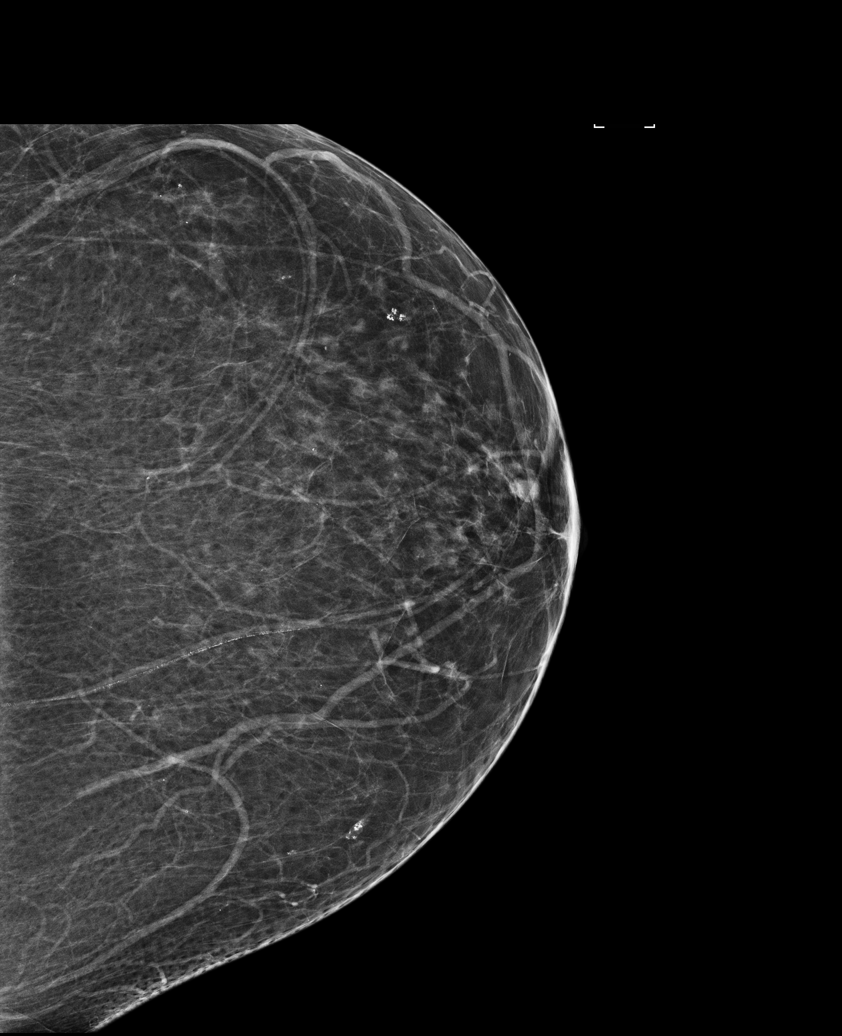

[L CC tomo · tomo slice 27/54.0]
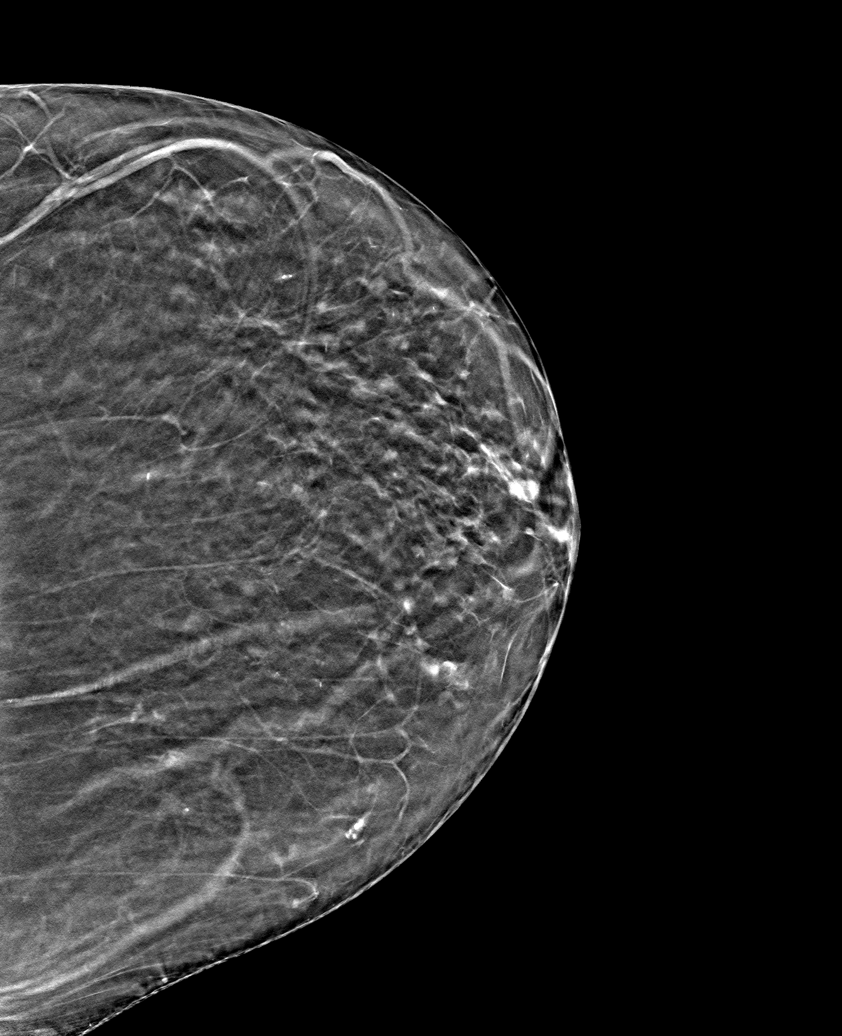

[L MLO tomo · tomo slice 33/66.0]
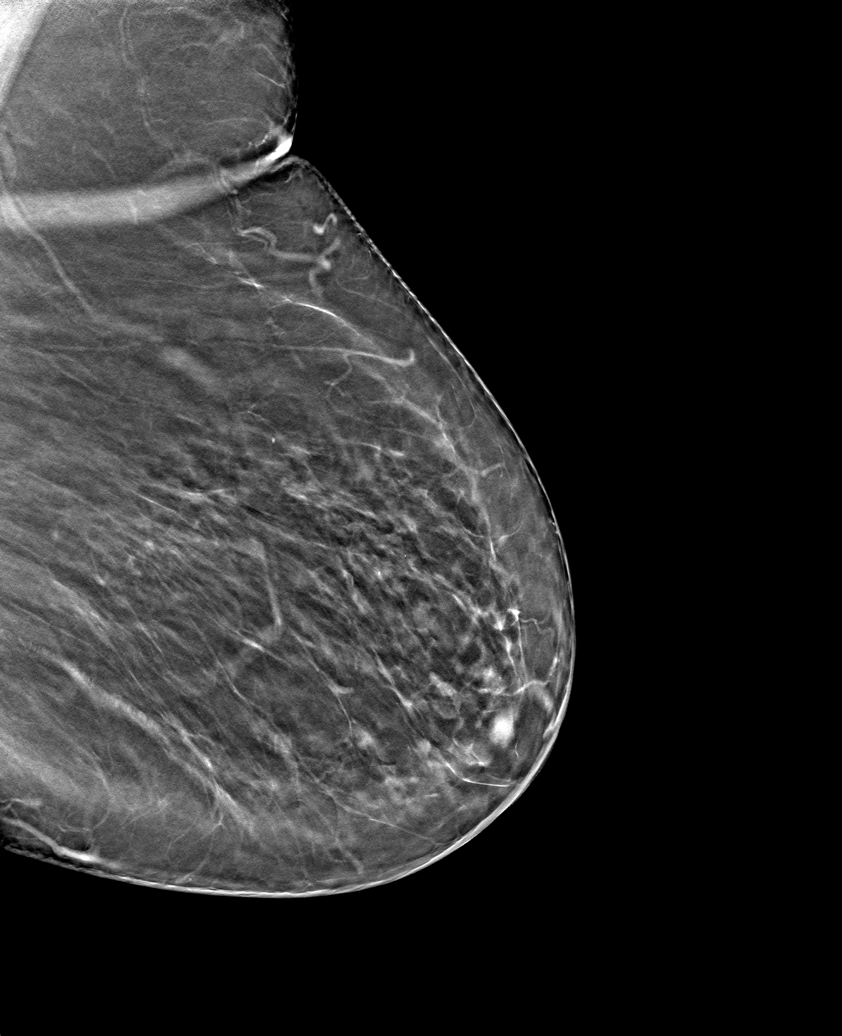

[6 of 14 positions shown; findings below may reference images not displayed]

ACR Breast Density Category b: There are scattered areas of
fibroglandular density.
FINDINGS: There is a persistent 8 mm oval mass in the subareolar left breast.

Mammographic images were processed with CAD.

There is no palpable mass identified in the subareolar left breast,
however the patient does complain of tenderness with palpation.

Ultrasound of the left breast at 1 o'clock, 1 cm from the nipple
demonstrates a circumscribed oval mass within a dilated duct
measuring 9 x 6 x 7 mm. Blood flow was seen within the mass on color
Doppler imaging. Ultrasound of the left axilla demonstrates multiple
normal-appearing lymph nodes.
IMPRESSION: 1. The mass in the left breast at 1 o'clock is indeterminate,
favored to represent a papilloma.

2.  No evidence of left axillary lymphadenopathy.

RECOMMENDATION:
Ultrasound-guided biopsy is recommended for the left breast mass at
1 o'clock. This has been scheduled for 11/30/2016 at [DATE] p.m..

I have discussed the findings and recommendations with the patient.
Results were also provided in writing at the conclusion of the
visit. If applicable, a reminder letter will be sent to the patient
regarding the next appointment.

BI-RADS CATEGORY  4: Suspicious.

## 2019-06-10 IMAGING — US US BREAST BX W LOC DEV 1ST LESION IMG BX SPEC US GUIDE*L*
1 series · 7 of 7 positions shown · non-contrast
Comparison: Previous exam(s).

ADDENDUM:
Pathology revealed INTRADUCTAL PAPILLOMA of the Left breast, 1:00
o'clock. This was found to be concordant by Dr. Rafe Jumper, with
excision recommended. Pathology results were discussed with the
patient by telephone. The patient reported doing well after the
biopsy with tenderness at the site. Post biopsy instructions and
care were reviewed and questions were answered. The patient was
encouraged to call The [REDACTED] for any
additional concerns. Surgical consultation has been arranged with
Dr. Ingunn Harpa Ronlor at [[email or web address]], per patient request,
on December 22, 2016.

Pathology results reported by Kenn Maduro, RN on 12/01/2016.
CLINICAL DATA: Patient with indeterminate left breast mass.
EXAM:
ULTRASOUND GUIDED LEFT BREAST CORE NEEDLE BIOPSY

[Series 1: us breast bx w loc dev 1st lesion img bx spec us g · 0.06mm/px · 7 of 7 slices shown]
[im 1/7]
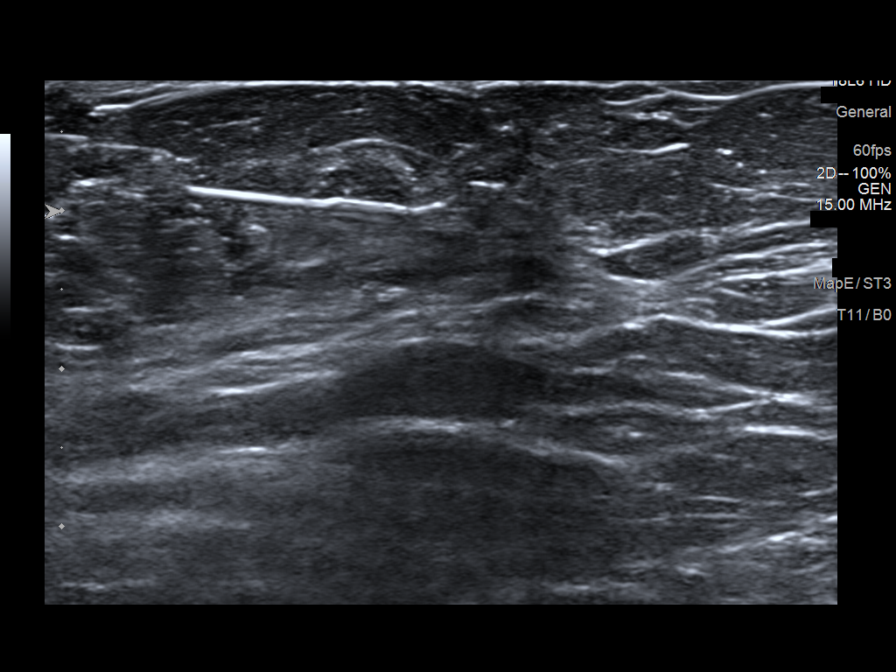
[im 2/7]
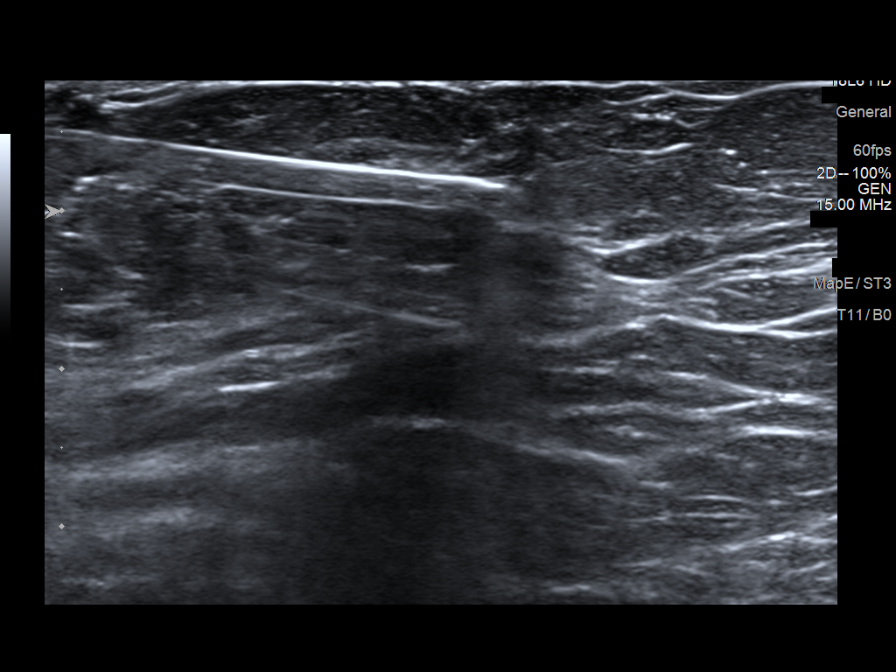
[im 3/7]
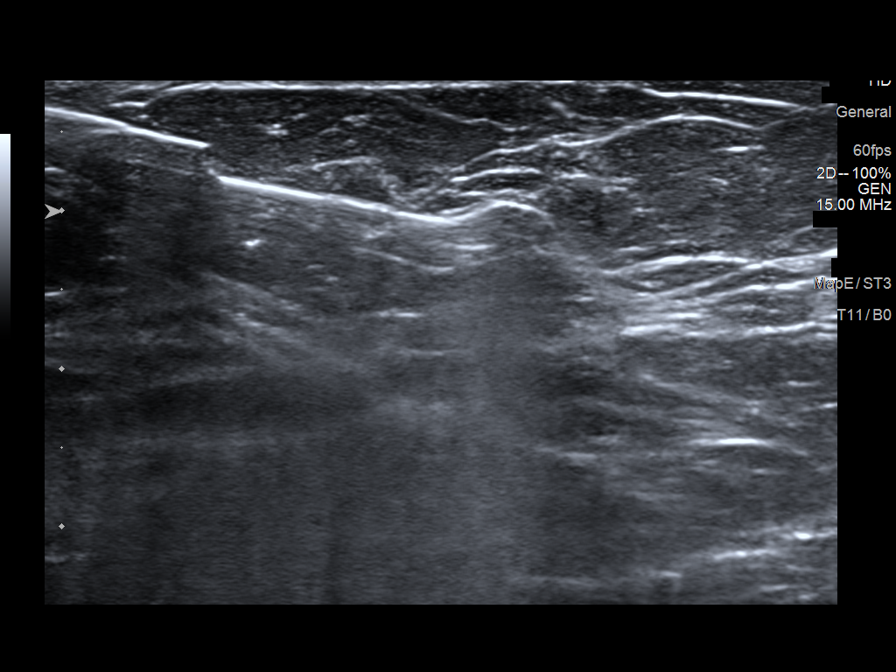
[im 4/7]
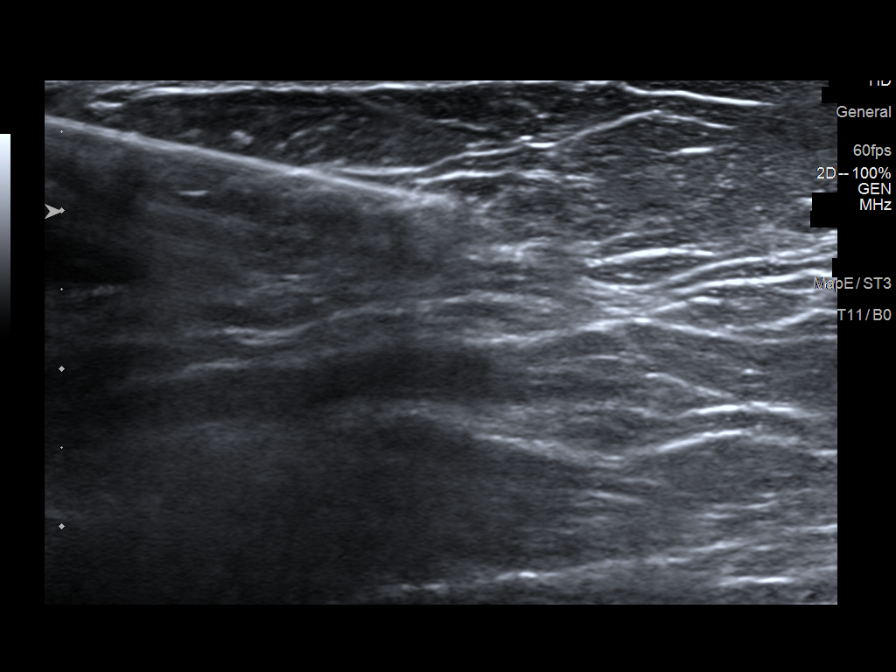
[im 5/7]
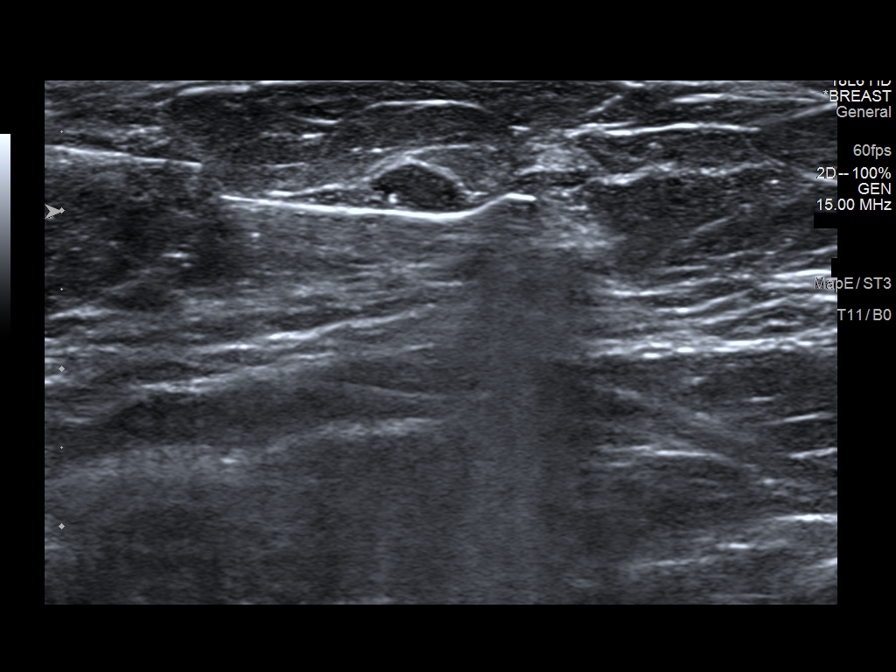
[im 6/7]
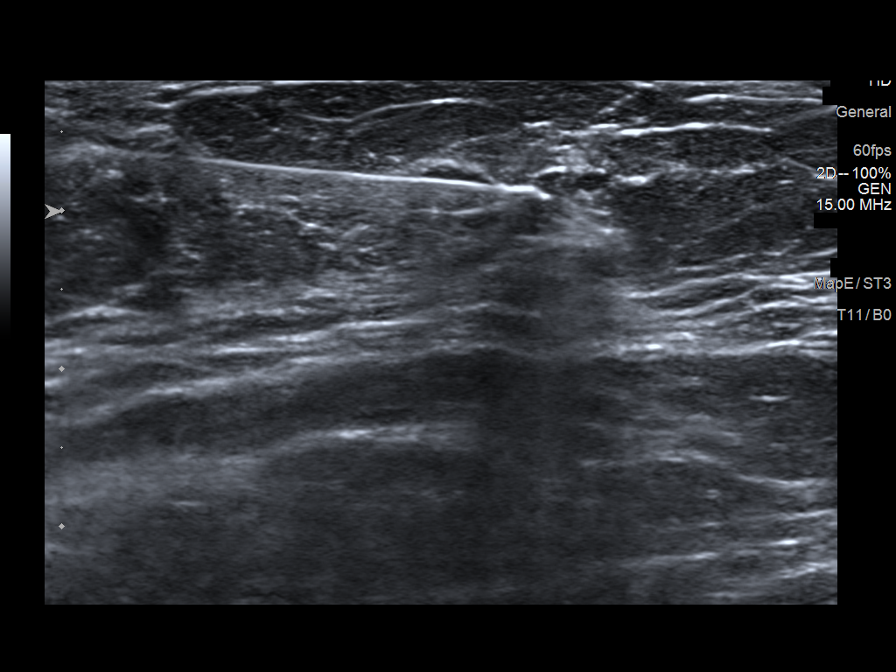
[im 7/7]
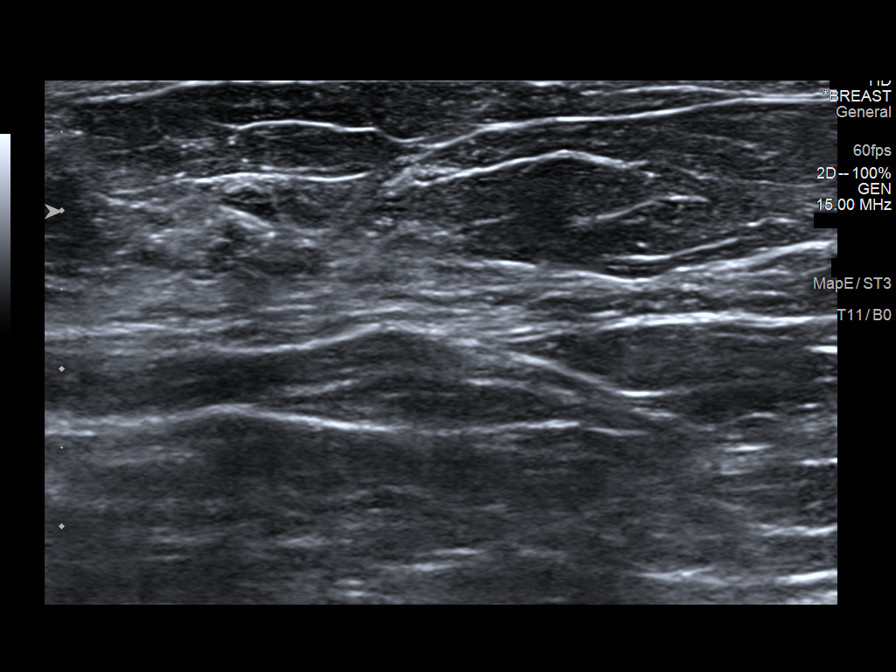

[7 of 7 positions shown; findings below may reference images not displayed]



Lesion quadrant: Upper outer quadrant

Using sterile technique and 1% Lidocaine as local anesthetic, under
direct ultrasound visualization, a 12 gauge Dastre device was
used to perform biopsy of left breast mass 1 o'clock position using
a medial approach. At the conclusion of the procedure a ribbon
shaped tissue marker clip was deployed into the biopsy cavity.
Follow up 2 view mammogram was performed and dictated separately.
IMPRESSION: Ultrasound guided biopsy of left breast mass 1 o'clock position. No
apparent complications.

## 2021-04-12 ENCOUNTER — Other Ambulatory Visit: Payer: Self-pay | Admitting: Obstetrics and Gynecology

## 2023-01-03 ENCOUNTER — Other Ambulatory Visit: Payer: Self-pay | Admitting: Obstetrics and Gynecology

## 2023-01-03 DIAGNOSIS — R928 Other abnormal and inconclusive findings on diagnostic imaging of breast: Secondary | ICD-10-CM

## 2023-01-10 ENCOUNTER — Ambulatory Visit
Admission: RE | Admit: 2023-01-10 | Discharge: 2023-01-10 | Disposition: A | Discharge status: Home / Self Care | Payer: Medicare Other | Source: Ambulatory Visit | Attending: Obstetrics and Gynecology | Admitting: Obstetrics and Gynecology

## 2023-01-10 DIAGNOSIS — R928 Other abnormal and inconclusive findings on diagnostic imaging of breast: Secondary | ICD-10-CM

## 2023-01-11 ENCOUNTER — Other Ambulatory Visit: Payer: Self-pay | Admitting: Obstetrics and Gynecology

## 2023-01-11 DIAGNOSIS — R921 Mammographic calcification found on diagnostic imaging of breast: Secondary | ICD-10-CM

## 2023-07-18 ENCOUNTER — Ambulatory Visit
Admission: RE | Admit: 2023-07-18 | Discharge: 2023-07-18 | Disposition: A | Discharge status: Home / Self Care | Payer: Medicare Other | Source: Ambulatory Visit | Attending: Obstetrics and Gynecology | Admitting: Obstetrics and Gynecology

## 2023-07-18 DIAGNOSIS — R921 Mammographic calcification found on diagnostic imaging of breast: Secondary | ICD-10-CM

## 2023-07-19 ENCOUNTER — Other Ambulatory Visit: Payer: Self-pay | Admitting: Obstetrics and Gynecology

## 2023-07-19 DIAGNOSIS — R921 Mammographic calcification found on diagnostic imaging of breast: Secondary | ICD-10-CM

## 2024-01-25 ENCOUNTER — Ambulatory Visit
Admission: RE | Admit: 2024-01-25 | Discharge: 2024-01-25 | Disposition: A | Discharge status: Home / Self Care | Source: Ambulatory Visit | Attending: Obstetrics and Gynecology | Admitting: Obstetrics and Gynecology

## 2024-01-25 DIAGNOSIS — R921 Mammographic calcification found on diagnostic imaging of breast: Secondary | ICD-10-CM
